# Patient Record
Sex: Male | Born: 1985
Health system: Southern US, Community
[De-identification: ages and names within clinical notes are randomized; demographics above are authoritative.]

## PROBLEM LIST (undated history)

## (undated) DIAGNOSIS — K219 Gastro-esophageal reflux disease without esophagitis: Secondary | ICD-10-CM

## (undated) DIAGNOSIS — F419 Anxiety disorder, unspecified: Secondary | ICD-10-CM

## (undated) DIAGNOSIS — G4730 Sleep apnea, unspecified: Secondary | ICD-10-CM

## (undated) DIAGNOSIS — F32A Depression, unspecified: Secondary | ICD-10-CM

## (undated) DIAGNOSIS — C801 Malignant (primary) neoplasm, unspecified: Secondary | ICD-10-CM

## (undated) HISTORY — DX: Anxiety disorder, unspecified: F41.9

## (undated) HISTORY — PX: OTHER SURGICAL HISTORY: SHX169

## (undated) HISTORY — DX: Depression, unspecified: F32.A

---

## 2014-07-14 ENCOUNTER — Other Ambulatory Visit: Payer: Self-pay | Admitting: Sports Medicine

## 2014-07-14 DIAGNOSIS — M79604 Pain in right leg: Secondary | ICD-10-CM

## 2014-07-14 DIAGNOSIS — M545 Low back pain: Secondary | ICD-10-CM

## 2014-07-14 DIAGNOSIS — M5126 Other intervertebral disc displacement, lumbar region: Secondary | ICD-10-CM

## 2014-07-19 ENCOUNTER — Ambulatory Visit
Admission: RE | Admit: 2014-07-19 | Discharge: 2014-07-19 | Disposition: A | Discharge status: Home / Self Care | Payer: 59 | Source: Ambulatory Visit | Attending: Sports Medicine | Admitting: Sports Medicine

## 2014-07-19 VITALS — BP 153/91 | HR 82 | Ht 71.0 in | Wt 280.0 lb

## 2014-07-19 DIAGNOSIS — M545 Low back pain, unspecified: Secondary | ICD-10-CM

## 2014-07-19 DIAGNOSIS — M5126 Other intervertebral disc displacement, lumbar region: Secondary | ICD-10-CM

## 2014-07-19 MED ORDER — METHYLPREDNISOLONE ACETATE 40 MG/ML INJ SUSP (RADIOLOG
120.0000 mg | Freq: Once | INTRAMUSCULAR | Status: AC
  Administered 2014-07-19: 120 mg via EPIDURAL

## 2014-07-19 MED ORDER — IOHEXOL 180 MG/ML  SOLN
1.0000 mL | Freq: Once | INTRAMUSCULAR | Status: AC | PRN
  Administered 2014-07-19: 1 mL via EPIDURAL

## 2014-07-19 NOTE — Discharge Instructions (Signed)

## 2014-07-25 ENCOUNTER — Other Ambulatory Visit: Payer: Self-pay | Admitting: Sports Medicine

## 2014-07-25 DIAGNOSIS — M5126 Other intervertebral disc displacement, lumbar region: Secondary | ICD-10-CM

## 2014-07-25 DIAGNOSIS — M545 Low back pain: Secondary | ICD-10-CM

## 2014-08-03 ENCOUNTER — Ambulatory Visit
Admission: RE | Admit: 2014-08-03 | Discharge: 2014-08-03 | Disposition: A | Discharge status: Home / Self Care | Payer: 59 | Source: Ambulatory Visit | Attending: Sports Medicine | Admitting: Sports Medicine

## 2014-08-03 DIAGNOSIS — M545 Low back pain: Secondary | ICD-10-CM

## 2014-08-03 DIAGNOSIS — M5126 Other intervertebral disc displacement, lumbar region: Secondary | ICD-10-CM

## 2014-08-03 MED ORDER — IOHEXOL 180 MG/ML  SOLN: 1.0000 mL | Freq: Once | INTRAMUSCULAR | Status: AC | PRN

## 2014-08-03 MED ORDER — METHYLPREDNISOLONE ACETATE 40 MG/ML INJ SUSP (RADIOLOG: 120.0000 mg | Freq: Once | INTRAMUSCULAR | Status: DC

## 2015-06-12 ENCOUNTER — Ambulatory Visit: Payer: Self-pay

## 2015-06-12 ENCOUNTER — Other Ambulatory Visit: Payer: Self-pay | Admitting: Occupational Medicine

## 2015-06-12 DIAGNOSIS — Z0000 Encounter for general adult medical examination without abnormal findings: Secondary | ICD-10-CM

## 2017-01-27 IMAGING — CR DG CHEST 1V
1 series · 1 of 1 positions shown · non-contrast
Comparison: None.

CLINICAL DATA: Annual physical exam

EXAM:
CHEST 1 VIEW

[view not recorded]
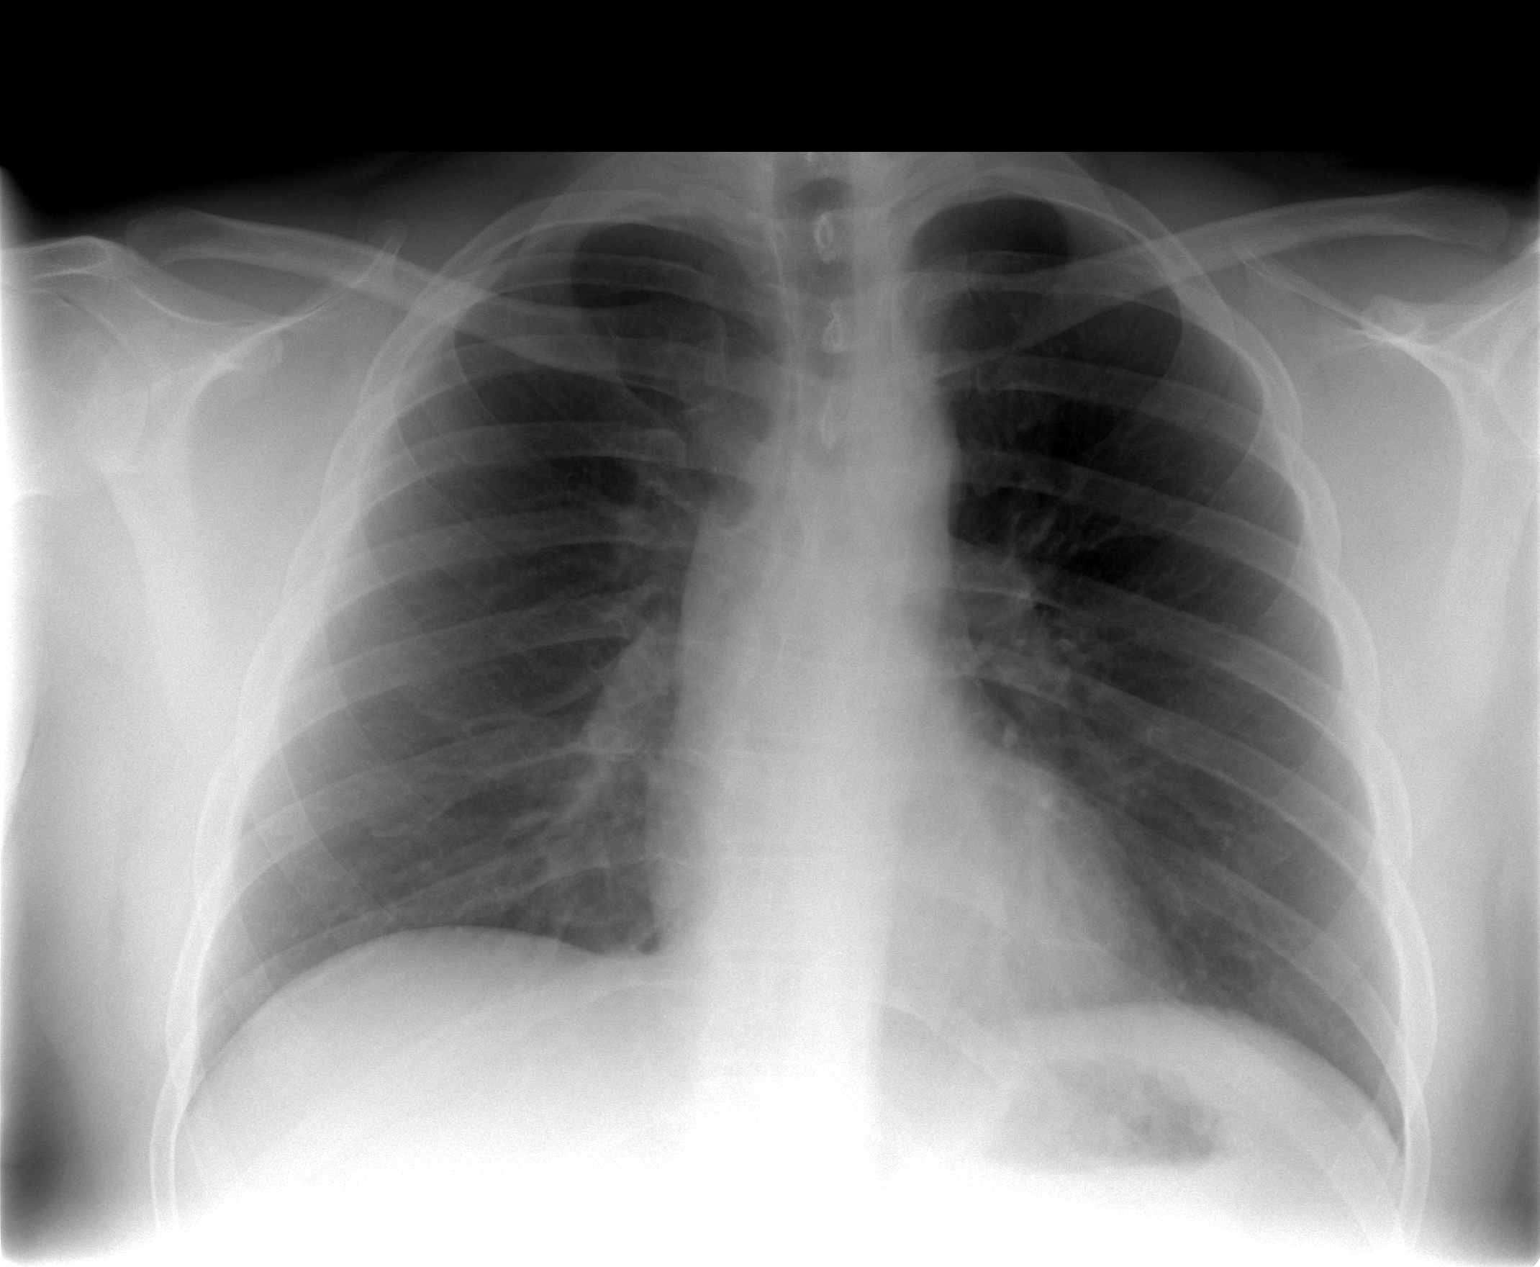

[1 of 1 positions shown; findings below may reference images not displayed]

FINDINGS: No active infiltrate or effusion is seen. Mediastinal and hilar
contours are unremarkable. The heart is within normal limits in
size. No bony abnormality is seen.
IMPRESSION: No active disease.

## 2019-09-19 ENCOUNTER — Other Ambulatory Visit (HOSPITAL_BASED_OUTPATIENT_CLINIC_OR_DEPARTMENT_OTHER): Payer: Self-pay

## 2019-09-19 DIAGNOSIS — G4730 Sleep apnea, unspecified: Secondary | ICD-10-CM

## 2019-12-08 ENCOUNTER — Ambulatory Visit (HOSPITAL_BASED_OUTPATIENT_CLINIC_OR_DEPARTMENT_OTHER): Payer: 59 | Attending: Psychiatry | Admitting: Internal Medicine

## 2019-12-08 ENCOUNTER — Other Ambulatory Visit: Payer: Self-pay

## 2019-12-08 DIAGNOSIS — G4733 Obstructive sleep apnea (adult) (pediatric): Secondary | ICD-10-CM | POA: Diagnosis not present

## 2019-12-08 DIAGNOSIS — G4730 Sleep apnea, unspecified: Secondary | ICD-10-CM

## 2019-12-10 DIAGNOSIS — G4730 Sleep apnea, unspecified: Secondary | ICD-10-CM

## 2019-12-10 NOTE — Procedures (Signed)
    Patient Name: Terry Anderson, Terry Anderson Date: 12/08/2019 Gender: Male D.O.B: Apr 07, 1986 Age (years): 33 Referring Provider: Angus Palms Height (inches): 71 Interpreting Physician: Jetty Duhamel MD, ABSM Weight (lbs): 270 RPSGT: Celene Kras BMI: 38 MRN: [id number] Neck Size: 17.00  CLINICAL INFORMATION Sleep Study Type: HST Indication for sleep study: OSA Epworth Sleepiness Score: 1  SLEEP STUDY TECHNIQUE A multi-channel overnight portable sleep study was performed. The channels recorded were: nasal airflow, thoracic respiratory movement, and oxygen saturation with a pulse oximetry. Snoring was also monitored.  MEDICATIONS Patient self administered medications include: none reported.  SLEEP ARCHITECTURE Patient was studied for 372.5 minutes. The sleep efficiency was 99.5 % and the patient was supine for 60.4%. The arousal index was 0.0 per hour.  RESPIRATORY PARAMETERS The overall AHI was 18.8 per hour, with a central apnea index of 0.0 per hour. The oxygen nadir was 79% during sleep.  CARDIAC DATA Mean heart rate during sleep was 68.0 bpm.  IMPRESSIONS - Moderate obstructive sleep apnea occurred during this study (AHI = 18.8/h). - No significant central sleep apnea occurred during this study (CAI = 0.0/h). - Oxygen desaturation was noted during this study (Min O2 = 79%). Mean sat 94%. - Patient snored.  DIAGNOSIS - Obstructive Sleep Apnea (G47.33)  RECOMMENDATIONS - Suggest CPAP titration sleep study or autopap. Other options would be based on clinical judgment. - Be careful with alcohol, sedatives and other CNS depressants that may worsen sleep apnea and disrupt normal sleep architecture. - Sleep hygiene should be reviewed to assess factors that may improve sleep quality. - Weight management and regular exercise should be initiated or continued.  [Electronically signed] 12/10/2019 12:14 PM  Jetty Duhamel MD, ABSM Diplomate, American Board of Sleep  Medicine   NPI: [id number]                         Jetty Duhamel Diplomate, American Board of Sleep Medicine  ELECTRONICALLY SIGNED ON:  12/10/2019, 12:12 PM [facility] PH: [phone]   FX: [phone] ACCREDITED BY THE AMERICAN ACADEMY OF SLEEP MEDICINE

## 2020-08-25 ENCOUNTER — Encounter (HOSPITAL_BASED_OUTPATIENT_CLINIC_OR_DEPARTMENT_OTHER): Payer: Self-pay | Admitting: *Deleted

## 2020-08-25 ENCOUNTER — Inpatient Hospital Stay (HOSPITAL_BASED_OUTPATIENT_CLINIC_OR_DEPARTMENT_OTHER)
Admission: EM | Admit: 2020-08-25 | Discharge: 2020-08-28 | DRG: 390 | Disposition: A | Discharge status: Home / Self Care | Payer: 59 | Attending: Internal Medicine | Admitting: Internal Medicine

## 2020-08-25 ENCOUNTER — Other Ambulatory Visit: Payer: Self-pay

## 2020-08-25 ENCOUNTER — Emergency Department (HOSPITAL_BASED_OUTPATIENT_CLINIC_OR_DEPARTMENT_OTHER): Payer: 59

## 2020-08-25 DIAGNOSIS — K219 Gastro-esophageal reflux disease without esophagitis: Secondary | ICD-10-CM | POA: Diagnosis present

## 2020-08-25 DIAGNOSIS — Z8616 Personal history of COVID-19: Secondary | ICD-10-CM

## 2020-08-25 DIAGNOSIS — K56609 Unspecified intestinal obstruction, unspecified as to partial versus complete obstruction: Secondary | ICD-10-CM | POA: Diagnosis not present

## 2020-08-25 DIAGNOSIS — R1010 Upper abdominal pain, unspecified: Secondary | ICD-10-CM | POA: Diagnosis not present

## 2020-08-25 DIAGNOSIS — R112 Nausea with vomiting, unspecified: Secondary | ICD-10-CM

## 2020-08-25 DIAGNOSIS — Z79899 Other long term (current) drug therapy: Secondary | ICD-10-CM

## 2020-08-25 DIAGNOSIS — F419 Anxiety disorder, unspecified: Secondary | ICD-10-CM | POA: Diagnosis present

## 2020-08-25 DIAGNOSIS — R739 Hyperglycemia, unspecified: Secondary | ICD-10-CM | POA: Diagnosis present

## 2020-08-25 DIAGNOSIS — R1084 Generalized abdominal pain: Secondary | ICD-10-CM

## 2020-08-25 DIAGNOSIS — F32A Depression, unspecified: Secondary | ICD-10-CM | POA: Diagnosis present

## 2020-08-25 LAB — COMPREHENSIVE METABOLIC PANEL
ALT: 33 U/L (ref 0–44)
AST: 18 U/L (ref 15–41)
Albumin: 5 g/dL (ref 3.5–5.0)
Alkaline Phosphatase: 70 U/L (ref 38–126)
Anion gap: 12 (ref 5–15)
BUN: 18 mg/dL (ref 6–20)
CO2: 25 mmol/L (ref 22–32)
Calcium: 10.1 mg/dL (ref 8.9–10.3)
Chloride: 99 mmol/L (ref 98–111)
Creatinine, Ser: 1.04 mg/dL (ref 0.61–1.24)
GFR, Estimated: >60 mL/min (ref >60)
Glucose, Bld: 151 mg/dL — ABNORMAL HIGH (ref 70–99)
Potassium: 4 mmol/L (ref 3.5–5.1)
Sodium: 136 mmol/L (ref 135–145)
Total Bilirubin: 0.6 mg/dL (ref 0.3–1.2)
Total Protein: 7.9 g/dL (ref 6.5–8.1)

## 2020-08-25 LAB — CBC
HCT: 49.7 % (ref 39.0–52.0)
Hemoglobin: 17.1 g/dL — ABNORMAL HIGH (ref 13.0–17.0)
MCH: 28.3 pg (ref 26.0–34.0)
MCHC: 34.4 g/dL (ref 30.0–36.0)
MCV: 82.3 fL (ref 80.0–100.0)
Platelets: 320 10*3/uL (ref 150–400)
RBC: 6.04 MIL/uL — ABNORMAL HIGH (ref 4.22–5.81)
RDW: 12.1 % (ref 11.5–15.5)
WBC: 10 10*3/uL (ref 4.0–10.5)
nRBC: 0 % (ref 0.0–0.2)

## 2020-08-25 LAB — URINALYSIS, ROUTINE W REFLEX MICROSCOPIC
Bilirubin Urine: NEGATIVE
Glucose, UA: NEGATIVE mg/dL
Hgb urine dipstick: NEGATIVE
Ketones, ur: NEGATIVE mg/dL
Leukocytes,Ua: NEGATIVE
Nitrite: NEGATIVE
Protein, ur: NEGATIVE mg/dL
Specific Gravity, Urine: 1.009 (ref 1.005–1.030)
pH: 6 (ref 5.0–8.0)

## 2020-08-25 LAB — LIPASE, BLOOD: Lipase: 14 U/L (ref 11–51)

## 2020-08-25 MED ORDER — FAMOTIDINE 20 MG PO TABS
20.0000 mg | ORAL_TABLET | Freq: Once | ORAL | Status: AC
  Administered 2020-08-25: 20 mg via ORAL
  Filled 2020-08-25: qty 1

## 2020-08-25 MED ORDER — HYDROMORPHONE HCL 1 MG/ML IJ SOLN
0.5000 mg | Freq: Once | INTRAMUSCULAR | Status: AC
  Administered 2020-08-25: 0.5 mg via INTRAVENOUS
  Filled 2020-08-25: qty 1

## 2020-08-25 MED ORDER — FAMOTIDINE IN NACL 20-0.9 MG/50ML-% IV SOLN
20.0000 mg | Freq: Once | INTRAVENOUS | Status: AC
  Administered 2020-08-25: 20 mg via INTRAVENOUS
  Filled 2020-08-25: qty 50

## 2020-08-25 MED ORDER — SODIUM CHLORIDE 0.9 % IV BOLUS
1000.0000 mL | Freq: Once | INTRAVENOUS | Status: AC
  Administered 2020-08-25: 1000 mL via INTRAVENOUS

## 2020-08-25 MED ORDER — ONDANSETRON 4 MG PO TBDP
4.0000 mg | ORAL_TABLET | Freq: Once | ORAL | Status: AC | PRN
  Administered 2020-08-25: 4 mg via ORAL
  Filled 2020-08-25: qty 1

## 2020-08-25 MED ORDER — ALUM & MAG HYDROXIDE-SIMETH 200-200-20 MG/5ML PO SUSP
30.0000 mL | Freq: Once | ORAL | Status: AC
  Administered 2020-08-25: 30 mL via ORAL
  Filled 2020-08-25: qty 30

## 2020-08-25 MED ORDER — METOCLOPRAMIDE HCL 5 MG/ML IJ SOLN
10.0000 mg | Freq: Once | INTRAMUSCULAR | Status: AC
  Administered 2020-08-25: 10 mg via INTRAVENOUS
  Filled 2020-08-25: qty 2

## 2020-08-25 MED ORDER — ONDANSETRON HCL 4 MG/2ML IJ SOLN
4.0000 mg | Freq: Once | INTRAMUSCULAR | Status: AC
  Administered 2020-08-25: 4 mg via INTRAVENOUS
  Filled 2020-08-25: qty 2

## 2020-08-25 MED ORDER — HYDROMORPHONE HCL 1 MG/ML IJ SOLN
1.0000 mg | Freq: Once | INTRAMUSCULAR | Status: AC
  Administered 2020-08-25: 1 mg via INTRAVENOUS
  Filled 2020-08-25: qty 1

## 2020-08-25 MED ORDER — LACTATED RINGERS IV BOLUS
1000.0000 mL | Freq: Once | INTRAVENOUS | Status: AC
  Administered 2020-08-25: 1000 mL via INTRAVENOUS

## 2020-08-25 NOTE — ED Notes (Signed)
Patient transported to CT 

## 2020-08-25 NOTE — ED Triage Notes (Signed)
Patient reports nausea vomiting (x1 episode) and abdominal pain since this am. Last BM last night.

## 2020-08-25 NOTE — ED Provider Notes (Addendum)
[facility] EMERGENCY DEPT Provider Note   CSN: [id number] Arrival date & time: 08/25/20  1732     History Chief Complaint  Patient presents with  . Abdominal Pain  . Nausea  . Emesis    Xayne Brumbaugh is a 35 y.o. male.  Patient c/o upper abd pain and vomiting earlier today. States acute onset symptoms around 10 am today, moderate, dull, non radiating, but seemed to move to left abd and then down - currently reports pain resolved. +nausea/vomiting. No bloody or bilious emesis. No abd distension. No diarrhea or constipation, had normal bm today. No known bad food ingestion or ill contacts. Takes antacid. Denies hx pud, pancreatitis or gallstones. No back/flank pain. No hx kidney stones. No hematuria.   The history is provided by the patient.  Abdominal Pain Associated symptoms: nausea and vomiting   Associated symptoms: no chest pain, no chills, no constipation, no cough, no diarrhea, no dysuria, no fever, no shortness of breath and no sore throat   Emesis Associated symptoms: abdominal pain   Associated symptoms: no chills, no cough, no diarrhea, no fever, no headaches and no sore throat        History reviewed. No pertinent past medical history.  There are no problems to display for this patient.   History reviewed. No pertinent surgical history.     History reviewed. No pertinent family history.  Social History   Tobacco Use  . Smoking status: Never Smoker  . Smokeless tobacco: Never Used    Home Medications Prior to Admission medications   Not on File    Allergies    Patient has no known allergies.  Review of Systems   Review of Systems  Constitutional: Negative for chills and fever.  HENT: Negative for sore throat.   Eyes: Negative for redness.  Respiratory: Negative for cough and shortness of breath.   Cardiovascular: Negative for chest pain and leg swelling.  Gastrointestinal: Positive for abdominal pain, nausea and vomiting. Negative  for blood in stool, constipation and diarrhea.  Genitourinary: Negative for dysuria and flank pain.  Musculoskeletal: Negative for back pain and neck pain.  Skin: Negative for rash.  Neurological: Negative for headaches.  Hematological: Does not bruise/bleed easily.  Psychiatric/Behavioral: Negative for confusion.    Physical Exam Updated Vital Signs BP (!) 146/100 (BP Location: Left Arm)   Pulse (!) 135   Temp 98.8 F (37.1 C) (Oral)   Resp 17   SpO2 97%   Physical Exam Vitals and nursing note reviewed.  Constitutional:      Appearance: Normal appearance. He is well-developed.  HENT:     Head: Atraumatic.     Nose: Nose normal.     Mouth/Throat:     Mouth: Mucous membranes are moist.     Pharynx: Oropharynx is clear.  Eyes:     General: No scleral icterus.    Conjunctiva/sclera: Conjunctivae normal.  Neck:     Trachea: No tracheal deviation.  Cardiovascular:     Rate and Rhythm: Regular rhythm. Tachycardia present.     Pulses: Normal pulses.     Heart sounds: Normal heart sounds. No murmur heard. No friction rub. No gallop.   Pulmonary:     Effort: Pulmonary effort is normal. No accessory muscle usage or respiratory distress.     Breath sounds: Normal breath sounds.  Abdominal:     General: Bowel sounds are normal. There is no distension.     Palpations: Abdomen is soft. There is no mass.  Tenderness: There is no abdominal tenderness. There is no guarding or rebound.     Hernia: No hernia is present.  Genitourinary:    Comments: No cva tenderness. Musculoskeletal:        General: No swelling or tenderness.     Cervical back: Normal range of motion and neck supple. No rigidity.     Right lower leg: No edema.     Left lower leg: No edema.  Skin:    General: Skin is warm and dry.     Findings: No rash.  Neurological:     Mental Status: He is alert.     Comments: Alert, speech clear.   Psychiatric:        Mood and Affect: Mood normal.     ED Results /  Procedures / Treatments   Labs (all labs ordered are listed, but only abnormal results are displayed) Results for orders placed or performed during the hospital encounter of 08/25/20  Comprehensive metabolic panel  Result Value Ref Range   Sodium 136 135 - 145 mmol/L   Potassium 4.0 3.5 - 5.1 mmol/L   Chloride 99 98 - 111 mmol/L   CO2 25 22 - 32 mmol/L   Glucose, Bld 151 (H) 70 - 99 mg/dL   BUN 18 6 - 20 mg/dL   Creatinine, Ser 2.24 0.61 - 1.24 mg/dL   Calcium 82.5 8.9 - 00.3 mg/dL   Total Protein 7.9 6.5 - 8.1 g/dL   Albumin 5.0 3.5 - 5.0 g/dL   AST 18 15 - 41 U/L   ALT 33 0 - 44 U/L   Alkaline Phosphatase 70 38 - 126 U/L   Total Bilirubin 0.6 0.3 - 1.2 mg/dL   GFR, Estimated >70 >48 mL/min   Anion gap 12 5 - 15  CBC  Result Value Ref Range   WBC 10.0 4.0 - 10.5 K/uL   RBC 6.04 (H) 4.22 - 5.81 MIL/uL   Hemoglobin 17.1 (H) 13.0 - 17.0 g/dL   HCT 88.9 16.9 - 45.0 %   MCV 82.3 80.0 - 100.0 fL   MCH 28.3 26.0 - 34.0 pg   MCHC 34.4 30.0 - 36.0 g/dL   RDW 38.8 82.8 - 00.3 %   Platelets 320 150 - 400 K/uL   nRBC 0.0 0.0 - 0.2 %  Lipase, blood  Result Value Ref Range   Lipase 14 11 - 51 U/L  Urinalysis, Routine w reflex microscopic Urine, Clean Catch  Result Value Ref Range   Color, Urine YELLOW YELLOW   APPearance CLEAR CLEAR   Specific Gravity, Urine 1.009 1.005 - 1.030   pH 6.0 5.0 - 8.0   Glucose, UA NEGATIVE NEGATIVE mg/dL   Hgb urine dipstick NEGATIVE NEGATIVE   Bilirubin Urine NEGATIVE NEGATIVE   Ketones, ur NEGATIVE NEGATIVE mg/dL   Protein, ur NEGATIVE NEGATIVE mg/dL   Nitrite NEGATIVE NEGATIVE   Leukocytes,Ua NEGATIVE NEGATIVE    EKG None  Radiology CT Abdomen Pelvis Wo Contrast  Result Date: 08/25/2020 CLINICAL DATA:  Acute abdominal pain. Epigastric and left-sided abdominal pain. Nausea and vomiting. EXAM: CT ABDOMEN AND PELVIS WITHOUT CONTRAST TECHNIQUE: Multidetector CT imaging of the abdomen and pelvis was performed following the standard protocol  without IV contrast. COMPARISON:  None. FINDINGS: Lower chest: Lung bases are clear. Hepatobiliary: Diffusely decreased hepatic density consistent with steatosis. No evidence of focal hepatic abnormality on noncontrast exam. Decompressed gallbladder. No calcified gallstone or pericholecystic inflammation. Pancreas: No ductal dilatation or inflammation. Spleen: Normal in size without focal  abnormality. Splenule at the hilum. Adrenals/Urinary Tract: Normal adrenal glands. No hydronephrosis or renal calculi. No perinephric edema. No evidence of focal renal abnormality on noncontrast exam. Unremarkable urinary bladder. Stomach/Bowel: Marked gastric distention with intraluminal fluid. Small bowel diffusely dilated and fluid-filled. There is gradual transition from dilated to nondilated bowel loops in the right pelvis, though no discrete transition point. Distal ileal bowel loops are decompressed. There is mild mesenteric edema. No mesenteric swirling to suggest internal hernia. No bowel pneumatosis. Normal appendix. Small volume of colonic stool. Vascular/Lymphatic: Unremarkable noncontrast vascular structures. No abdominopelvic adenopathy. Reproductive: Prostate is unremarkable. Other: Minimal mesenteric edema. No ascites or free air. Tiny fat containing umbilical hernia. Minimal fat in the inguinal canals, right greater than left. Musculoskeletal: Mild L5-S1 degenerative disc disease, no pars defects. There are no acute or suspicious osseous abnormalities. IMPRESSION: 1. Diffusely dilated and fluid-filled small bowel with gradual transition from dilated to nondilated bowel loops in the right pelvis, though no discrete transition point. There is mild mesenteric edema. Findings are suspicious for early small bowel obstruction, underlying cause is not delineated on this exam. 2. Hepatic steatosis. 3. Tiny fat containing umbilical hernia. Minimal fat in the inguinal canals, right greater than left. Electronically Signed    By: Narda Rutherford M.D.   On: 08/25/2020 22:08    Procedures Procedures   Medications Ordered in ED Medications  HYDROmorphone (DILAUDID) injection 0.5 mg (has no administration in time range)  ondansetron (ZOFRAN-ODT) disintegrating tablet 4 mg (4 mg Oral Given 08/25/20 1754)  sodium chloride 0.9 % bolus 1,000 mL (1,000 mLs Intravenous New Bag/Given 08/25/20 1835)  ondansetron (ZOFRAN) injection 4 mg (4 mg Intravenous Given 08/25/20 1835)    ED Course  I have reviewed the triage vital signs and the nursing notes.  Pertinent labs & imaging results that were available during my care of the patient were reviewed by me and considered in my medical decision making (see chart for details).    MDM Rules/Calculators/A&P                         Iv ns bolus. Labs sent. zofran iv.   Reviewed nursing notes and prior charts for additional history.   Labs reviewed/interpreted by me - wbc normal, hct normal.   Pain recurs, left flank. Moderate/persistent. Will get ct.   CT reviewed/interpreted by me - ?sbo.   No incarcerated hernia on exam, no hx abd surgery - cause of suspected sbo not clear.   Pt continues to have episodic nv and pain. Remains tachy. Additional LR bolus. Dilaudid 1 mg iv. zofran iv.   Medicine consulted for admission. Discussed w Dr Delma Post who accepts in transfer/admit.   NG tube to liws.     Final Clinical Impression(s) / ED Diagnoses Final diagnoses:  None    Rx / DC Orders ED Discharge Orders    None          Cathren Laine, MD 08/25/20 2329

## 2020-08-25 NOTE — ED Notes (Signed)
Patient with large episode of vomiting during triage. IV and labs drawn in triage, patient moved to room.

## 2020-08-26 DIAGNOSIS — R739 Hyperglycemia, unspecified: Secondary | ICD-10-CM | POA: Diagnosis present

## 2020-08-26 DIAGNOSIS — Z8616 Personal history of COVID-19: Secondary | ICD-10-CM | POA: Diagnosis not present

## 2020-08-26 DIAGNOSIS — R112 Nausea with vomiting, unspecified: Secondary | ICD-10-CM | POA: Diagnosis not present

## 2020-08-26 DIAGNOSIS — R1010 Upper abdominal pain, unspecified: Secondary | ICD-10-CM | POA: Diagnosis present

## 2020-08-26 DIAGNOSIS — Z79899 Other long term (current) drug therapy: Secondary | ICD-10-CM | POA: Diagnosis not present

## 2020-08-26 DIAGNOSIS — F419 Anxiety disorder, unspecified: Secondary | ICD-10-CM | POA: Diagnosis present

## 2020-08-26 DIAGNOSIS — K56609 Unspecified intestinal obstruction, unspecified as to partial versus complete obstruction: Secondary | ICD-10-CM | POA: Diagnosis present

## 2020-08-26 DIAGNOSIS — F32A Depression, unspecified: Secondary | ICD-10-CM | POA: Diagnosis present

## 2020-08-26 DIAGNOSIS — K219 Gastro-esophageal reflux disease without esophagitis: Secondary | ICD-10-CM | POA: Diagnosis present

## 2020-08-26 LAB — RAPID URINE DRUG SCREEN, HOSP PERFORMED
Amphetamines: NOT DETECTED
Barbiturates: NOT DETECTED
Benzodiazepines: NOT DETECTED
Cocaine: NOT DETECTED
Opiates: NOT DETECTED
Tetrahydrocannabinol: NOT DETECTED

## 2020-08-26 LAB — HEMOGLOBIN A1C
Hgb A1c MFr Bld: 5.5 % (ref 4.8–5.6)
Mean Plasma Glucose: 111.15 mg/dL

## 2020-08-26 LAB — RESP PANEL BY RT-PCR (FLU A&B, COVID) ARPGX2
Influenza A by PCR: NEGATIVE
Influenza B by PCR: NEGATIVE
SARS Coronavirus 2 by RT PCR: POSITIVE — AB

## 2020-08-26 LAB — HIV ANTIBODY (ROUTINE TESTING W REFLEX): HIV Screen 4th Generation wRfx: NONREACTIVE

## 2020-08-26 MED ORDER — LACTATED RINGERS IV SOLN: INTRAVENOUS | Status: DC

## 2020-08-26 MED ORDER — PROCHLORPERAZINE EDISYLATE 10 MG/2ML IJ SOLN: 10.0000 mg | Freq: Four times a day (QID) | INTRAMUSCULAR | Status: DC | PRN

## 2020-08-26 MED ORDER — PANTOPRAZOLE SODIUM 40 MG PO TBEC
40.0000 mg | DELAYED_RELEASE_TABLET | Freq: Every day | ORAL | Status: DC
  Administered 2020-08-26 – 2020-08-28 (×3): 40 mg via ORAL
  Filled 2020-08-26 (×3): qty 1

## 2020-08-26 MED ORDER — TRAMADOL HCL 50 MG PO TABS
50.0000 mg | ORAL_TABLET | Freq: Three times a day (TID) | ORAL | Status: DC | PRN
  Administered 2020-08-28: 50 mg via ORAL
  Filled 2020-08-26: qty 1

## 2020-08-26 MED ORDER — BUPROPION HCL ER (SR) 100 MG PO TB12
200.0000 mg | ORAL_TABLET | Freq: Two times a day (BID) | ORAL | Status: DC
  Administered 2020-08-26 – 2020-08-28 (×5): 200 mg via ORAL
  Filled 2020-08-26 (×5): qty 2

## 2020-08-26 MED ORDER — LORAZEPAM 2 MG/ML IJ SOLN
0.5000 mg | Freq: Once | INTRAMUSCULAR | Status: AC
  Administered 2020-08-26: 0.5 mg via INTRAVENOUS
  Filled 2020-08-26: qty 1

## 2020-08-26 NOTE — Plan of Care (Signed)

## 2020-08-26 NOTE — ED Notes (Signed)
Pt ambulated to the bathroom steady gait

## 2020-08-26 NOTE — ED Notes (Signed)
Report given to Tiffany @ [facility]

## 2020-08-26 NOTE — ED Notes (Signed)
Introduced myself to pt - pt requesting his Wellbutrin - having anxiety and has not taken his meds since yesterday

## 2020-08-26 NOTE — H&P (Signed)
History and Physical    Terry Anderson IPJ:[id number] DOB: 08-Sep-1985 DOA: 08/25/2020  PCP: Pcp, No  Patient coming from: Home  Chief Complaint: ab pain  HPI: Terry Anderson is a 35 y.o. male with medical history significant of anxiety/depression. Presenting with abdominal pain, N/V. He was in his normal state of health until yesterday morning. He reports that he woke up yesterday morning with sharp abdominal pain across the upper quadrants. It was constant, but worse with movement. He had nausea and vomiting at the same time. He thought it was constipated, so he took some laxatives. He had a small BM, but it didn't help the pain. He tried a hot shower. It helped the pain and nausea a little, but didn't completely relieve the symptoms. Later in the evening, he noticed that he was having diarrhea and that continued all night. He decided to come to the ED for help. He denies any other aggravating or alleviating factors.    ED Course: His labs were remarkable only for hyperglycemia. His imaging showed possible early SBO. He was tachycardic. He was going to have an NGT place, but then he had BMs and his N/V improved. [facility] was called for admission.   Review of Systems:  Denies CP, palpitations, dyspnea, lightheadedness, weakness, sick contacts, hematochezia, hematemesis. Review of systems is otherwise negative for all not mentioned in HPI.   PMHx GERD Depression/Anxiety  PSHx None  SocHx  reports that he has never smoked. He has never used smokeless tobacco. No history on file for alcohol use and drug use.  No Known Allergies  FamHx History reviewed. No pertinent family history.  Prior to Admission medications   Medication Sig Start Date End Date Taking? Authorizing Provider  buPROPion (WELLBUTRIN SR) 200 MG 12 hr tablet Take 200 mg by mouth 2 (two) times daily. 06/04/20   [provider]    Physical Exam: Vitals:   08/26/20 0711 08/26/20 0713 08/26/20 0806 08/26/20 0849  BP: (!)  146/101 (!) 146/101 (!) 141/99 (!) 156/104  Pulse:  (!) 101 (!) 104 99  Resp: (!) 22 (!) 22 (!) 21 20  Temp:  98.8 F (37.1 C)  98.2 F (36.8 C)  TempSrc:  Oral  Oral  SpO2:  99% 94% 99%  Weight:  122 kg    Height:  5\' 11"  (1.803 m)      General: 35 y.o. male resting in bed in NAD Eyes: PERRL, normal sclera ENMT: Nares patent w/o discharge, orophaynx clear, dentition normal, ears w/o discharge/lesions/ulcers Neck: Supple, trachea midline Cardiovascular: RRR, +S1, S2, no m/g/r, equal pulses throughout Respiratory: CTABL, no w/r/r, normal WOB GI: BS+, NDNT, no masses noted, no organomegaly noted MSK: No e/c/c Skin: No rashes, bruises, ulcerations noted Neuro: A&O x 3, no focal deficits Psyc: Appropriate interaction and affect, calm/cooperative  Labs on Admission: I have personally reviewed following labs and imaging studies  CBC: Recent Labs  Lab 08/25/20 1813  WBC 10.0  HGB 17.1*  HCT 49.7  MCV 82.3  PLT 320   Basic Metabolic Panel: Recent Labs  Lab 08/25/20 1813  NA 136  K 4.0  CL 99  CO2 25  GLUCOSE 151*  BUN 18  CREATININE 1.04  CALCIUM 10.1   GFR: Estimated Creatinine Clearance: 131.8 mL/min (by C-G formula based on SCr of 1.04 mg/dL). Liver Function Tests: Recent Labs  Lab 08/25/20 1813  AST 18  ALT 33  ALKPHOS 70  BILITOT 0.6  PROT 7.9  ALBUMIN 5.0   Recent Labs  Lab 08/25/20 1813  LIPASE 14   No results for input(s): AMMONIA in the last 168 hours. Coagulation Profile: No results for input(s): INR, PROTIME in the last 168 hours. Cardiac Enzymes: No results for input(s): CKTOTAL, CKMB, CKMBINDEX, TROPONINI in the last 168 hours. BNP (last 3 results) No results for input(s): PROBNP in the last 8760 hours. HbA1C: No results for input(s): HGBA1C in the last 72 hours. CBG: No results for input(s): GLUCAP in the last 168 hours. Lipid Profile: No results for input(s): CHOL, HDL, LDLCALC, TRIG, CHOLHDL, LDLDIRECT in the last 72  hours. Thyroid Function Tests: No results for input(s): TSH, T4TOTAL, FREET4, T3FREE, THYROIDAB in the last 72 hours. Anemia Panel: No results for input(s): VITAMINB12, FOLATE, FERRITIN, TIBC, IRON, RETICCTPCT in the last 72 hours. Urine analysis:    Component Value Date/Time   COLORURINE YELLOW 08/25/2020 1802   APPEARANCEUR CLEAR 08/25/2020 1802   LABSPEC 1.009 08/25/2020 1802   PHURINE 6.0 08/25/2020 1802   GLUCOSEU NEGATIVE 08/25/2020 1802   HGBUR NEGATIVE 08/25/2020 1802   BILIRUBINUR NEGATIVE 08/25/2020 1802   KETONESUR NEGATIVE 08/25/2020 1802   PROTEINUR NEGATIVE 08/25/2020 1802   NITRITE NEGATIVE 08/25/2020 1802   LEUKOCYTESUR NEGATIVE 08/25/2020 1802    Radiological Exams on Admission: CT Abdomen Pelvis Wo Contrast  Result Date: 08/25/2020 CLINICAL DATA:  Acute abdominal pain. Epigastric and left-sided abdominal pain. Nausea and vomiting. EXAM: CT ABDOMEN AND PELVIS WITHOUT CONTRAST TECHNIQUE: Multidetector CT imaging of the abdomen and pelvis was performed following the standard protocol without IV contrast. COMPARISON:  None. FINDINGS: Lower chest: Lung bases are clear. Hepatobiliary: Diffusely decreased hepatic density consistent with steatosis. No evidence of focal hepatic abnormality on noncontrast exam. Decompressed gallbladder. No calcified gallstone or pericholecystic inflammation. Pancreas: No ductal dilatation or inflammation. Spleen: Normal in size without focal abnormality. Splenule at the hilum. Adrenals/Urinary Tract: Normal adrenal glands. No hydronephrosis or renal calculi. No perinephric edema. No evidence of focal renal abnormality on noncontrast exam. Unremarkable urinary bladder. Stomach/Bowel: Marked gastric distention with intraluminal fluid. Small bowel diffusely dilated and fluid-filled. There is gradual transition from dilated to nondilated bowel loops in the right pelvis, though no discrete transition point. Distal ileal bowel loops are decompressed. There  is mild mesenteric edema. No mesenteric swirling to suggest internal hernia. No bowel pneumatosis. Normal appendix. Small volume of colonic stool. Vascular/Lymphatic: Unremarkable noncontrast vascular structures. No abdominopelvic adenopathy. Reproductive: Prostate is unremarkable. Other: Minimal mesenteric edema. No ascites or free air. Tiny fat containing umbilical hernia. Minimal fat in the inguinal canals, right greater than left. Musculoskeletal: Mild L5-S1 degenerative disc disease, no pars defects. There are no acute or suspicious osseous abnormalities. IMPRESSION: 1. Diffusely dilated and fluid-filled small bowel with gradual transition from dilated to nondilated bowel loops in the right pelvis, though no discrete transition point. There is mild mesenteric edema. Findings are suspicious for early small bowel obstruction, underlying cause is not delineated on this exam. 2. Hepatic steatosis. 3. Tiny fat containing umbilical hernia. Minimal fat in the inguinal canals, right greater than left. Electronically Signed   By: Narda Rutherford M.D.   On: 08/25/2020 22:08    EKG: Independently reviewed. NSR, no st elevation  Assessment/Plan Early SBO N/V/abdominal pain     - CT shows: Diffusely dilated and fluid-filled small bowel with gradual transition from dilated to nondilated bowel loops in the right pelvis, though no discrete transition point. There is mild mesenteric edema. Findings are suspicious for early small bowel obstruction, underlying cause is not delineated on  this exam.     - his symptoms have largely resolved; he endorses BM since arriving at ED     - CLD, fluids, antiemetics, pain control PRN  Anxiety/Depression     - continue wellbutrin  Hyperglycemia     - no Hx of DM, check A1c  DVT prophylaxis: lovenox  Code Status: FULL  Family Communication: None at bedside  Consults called: None   Status is: Inpatient  Remains inpatient appropriate because:Hemodynamically unstable  and IV treatments appropriate due to intensity of illness or inability to take PO   Dispo: The patient is from: Home              Anticipated d/c is to: Home              Patient currently is not medically stable to d/c.   Difficult to place patient No  Time spent coordinating admission: 60 minutes  Reily Ilic A Koehn Salehi DO [facility]  If 7PM-7AM, please contact night-coverage [email or web address]  08/26/2020, 9:13 AM

## 2020-08-26 NOTE — ED Notes (Signed)
Pt requesting antianxiety meds - wellbutrin not avail at this location  Pt updated that [facility] is on the way

## 2020-08-26 NOTE — ED Notes (Signed)
Dr. Adela Lank aware of positive covid.

## 2020-08-27 ENCOUNTER — Inpatient Hospital Stay (HOSPITAL_COMMUNITY): Payer: 59

## 2020-08-27 DIAGNOSIS — K56609 Unspecified intestinal obstruction, unspecified as to partial versus complete obstruction: Principal | ICD-10-CM

## 2020-08-27 LAB — COMPREHENSIVE METABOLIC PANEL
ALT: 30 U/L (ref 0–44)
AST: 16 U/L (ref 15–41)
Albumin: 4.2 g/dL (ref 3.5–5.0)
Alkaline Phosphatase: 54 U/L (ref 38–126)
Anion gap: 9 (ref 5–15)
BUN: 10 mg/dL (ref 6–20)
CO2: 28 mmol/L (ref 22–32)
Calcium: 9.2 mg/dL (ref 8.9–10.3)
Chloride: 104 mmol/L (ref 98–111)
Creatinine, Ser: 1.03 mg/dL (ref 0.61–1.24)
GFR, Estimated: >60 mL/min (ref >60)
Glucose, Bld: 92 mg/dL (ref 70–99)
Potassium: 3.7 mmol/L (ref 3.5–5.1)
Sodium: 141 mmol/L (ref 135–145)
Total Bilirubin: 0.8 mg/dL (ref 0.3–1.2)
Total Protein: 7 g/dL (ref 6.5–8.1)

## 2020-08-27 LAB — CBC
HCT: 44.5 % (ref 39.0–52.0)
Hemoglobin: 14.7 g/dL (ref 13.0–17.0)
MCH: 28.4 pg (ref 26.0–34.0)
MCHC: 33 g/dL (ref 30.0–36.0)
MCV: 86.1 fL (ref 80.0–100.0)
Platelets: 238 10*3/uL (ref 150–400)
RBC: 5.17 MIL/uL (ref 4.22–5.81)
RDW: 12.1 % (ref 11.5–15.5)
WBC: 7.1 10*3/uL (ref 4.0–10.5)
nRBC: 0 % (ref 0.0–0.2)

## 2020-08-27 NOTE — TOC Progression Note (Signed)
Transition of Care [facility]) - Progression Note    Patient Details  Name: Silvia Markuson MRN: [id number] Date of Birth: 05-17-1985  Transition of Care [facility] CM/SW Contact  Geni Bers, RN Phone Number: 08/27/2020, 3:56 PM  Clinical Narrative:     Pt from home with spouse and plan to return home. TOC will continue to follow for any discharge home needs.   Expected Discharge Plan: Home/Self Care Barriers to Discharge: No Barriers Identified  Expected Discharge Plan and Services Expected Discharge Plan: Home/Self Care       Living arrangements for the past 2 months: Single Family Home                                       Social Determinants of Health (SDOH) Interventions    Readmission Risk Interventions No flowsheet data found.

## 2020-08-27 NOTE — Plan of Care (Signed)

## 2020-08-27 NOTE — Progress Notes (Signed)
PROGRESS NOTE    Terry Anderson  BZJ:[id number] DOB: 1985-12-03 DOA: 08/25/2020 PCP: Pcp, No   Chief Complain: Abdominal pain  Brief Narrative: Patient is a 35 year old male with history of anxiety/depression who presented with abdomen pain, nausea and vomiting.  He thought he was constipated and took some laxatives and had a small bowel movement but it did not help the pain.  He presented to the emergency department.  Lab work showed hyperglycemia.  CT abdomen/pelvis done in the emerge department showed possible early SBO, he was tachycardic on presentation.  After the CT scan he had bowel movements and his nausea and vomiting improved so NG tube not placed.  He continues to feel better today, but no bowel movement.  Plan to advance the diet and order a KUB for follow-up  Assessment & Plan:   Active Problems:   SBO (small bowel obstruction) (HCC)   Early SBO/nausea/vomiting/abdominal pain: CT abdomen/pelvis showed diffusely dilated, fluid-filled small bowel with gradual transition from dilated to nondilated bowel loops in the right pelvis.  No discrete transition point.  Findings with suspicion for early small bowel SBO After the CT scan, he started having bowel movements, so NG tube not placed.  His abdomen remains soft and nondistended today.  He denies any abdominal pain nausea or vomiting.  Plan is to advance her diet to full liquid for now and check abdominal x-ray.  If abdominal x-ray shows improvement, will advance the diet to soft and plan for discharge tomorrow to home.  COVID positivity: Initially found to be positive on 4/17.  Screening test was negative as per the rapid test was done at home few days ago.  He is going to work.  We will continue off isolation, Out of window for isolation,.  He does not have any symptoms of COVID.  No indication for treatment.   Anxiety/depression: On Wellbutrin  Hyperglycemia:Likely stress related. Hemoglobin A1c of 5.5.         DVT  prophylaxis:SCD Code Status: Full Family Communication: None at bed ide Status is: Inpatient  Remains inpatient appropriate because:Unsafe d/c plan   Dispo: The patient is from: Home              Anticipated d/c is to: Home              Patient currently is not medically stable to d/c.   Difficult to place patient No      Consultants: None Procedures:None  Antimicrobials:  Anti-infectives (From admission, onward)   None      Subjective:  Patient seen and examined the bedside this morning.  Hemodynamically stable.  Denies any abdomen pain no nausea or vomiting.  Interested to advance the diet   Objective: Vitals:   08/26/20 1653 08/26/20 2050 08/27/20 0050 08/27/20 0455  BP: 125/89 (!) 140/92 (!) 142/79 134/89  Pulse: (!) 102  94 80  Resp:  20 20 18   Temp: (!) 97.5 F (36.4 C)  98.3 F (36.8 C) 98.1 F (36.7 C)  TempSrc: Oral Oral Oral Oral  SpO2: 98% 99% 99% 99%  Weight:      Height:        Intake/Output Summary (Last 24 hours) at 08/27/2020 1012 Last data filed at 08/27/2020 0732 Gross per 24 hour  Intake 1432.27 ml  Output --  Net 1432.27 ml   Filed Weights   08/26/20 0713  Weight: 122 kg    Examination:  General exam: Appears calm and comfortable ,Not in distress HEENT:PERRL,Oral mucosa moist,  Ear/Nose normal on gross exam Respiratory system: Bilateral equal air entry, normal vesicular breath sounds, no wheezes or crackles  Cardiovascular system: S1 & S2 heard, RRR. No JVD, murmurs, rubs, gallops or clicks. No pedal edema. Gastrointestinal system: Abdomen is nondistended, soft and nontender. No organomegaly or masses felt. Slow  bowel sounds heard. Central nervous system: Alert and oriented. No focal neurological deficits. Extremities: No edema, no clubbing ,no cyanosis Skin: No rashes, lesions or ulcers,no icterus ,no pallor   Data Reviewed: I have personally reviewed following labs and imaging studies  CBC: Recent Labs  Lab 08/25/20 1813  08/27/20 0354  WBC 10.0 7.1  HGB 17.1* 14.7  HCT 49.7 44.5  MCV 82.3 86.1  PLT 320 238   Basic Metabolic Panel: Recent Labs  Lab 08/25/20 1813 08/27/20 0354  NA 136 141  K 4.0 3.7  CL 99 104  CO2 25 28  GLUCOSE 151* 92  BUN 18 10  CREATININE 1.04 1.03  CALCIUM 10.1 9.2   GFR: Estimated Creatinine Clearance: 133.1 mL/min (by C-G formula based on SCr of 1.03 mg/dL). Liver Function Tests: Recent Labs  Lab 08/25/20 1813 08/27/20 0354  AST 18 16  ALT 33 30  ALKPHOS 70 54  BILITOT 0.6 0.8  PROT 7.9 7.0  ALBUMIN 5.0 4.2   Recent Labs  Lab 08/25/20 1813  LIPASE 14   No results for input(s): AMMONIA in the last 168 hours. Coagulation Profile: No results for input(s): INR, PROTIME in the last 168 hours. Cardiac Enzymes: No results for input(s): CKTOTAL, CKMB, CKMBINDEX, TROPONINI in the last 168 hours. BNP (last 3 results) No results for input(s): PROBNP in the last 8760 hours. HbA1C: Recent Labs    08/26/20 1003  HGBA1C 5.5   CBG: No results for input(s): GLUCAP in the last 168 hours. Lipid Profile: No results for input(s): CHOL, HDL, LDLCALC, TRIG, CHOLHDL, LDLDIRECT in the last 72 hours. Thyroid Function Tests: No results for input(s): TSH, T4TOTAL, FREET4, T3FREE, THYROIDAB in the last 72 hours. Anemia Panel: No results for input(s): VITAMINB12, FOLATE, FERRITIN, TIBC, IRON, RETICCTPCT in the last 72 hours. Sepsis Labs: No results for input(s): PROCALCITON, LATICACIDVEN in the last 168 hours.  Recent Results (from the past 240 hour(s))  Resp Panel by RT-PCR (Flu A&B, Covid) Nasopharyngeal Swab     Status: Abnormal   Collection Time: 08/25/20 10:38 PM   Specimen: Nasopharyngeal Swab; Nasopharyngeal(NP) swabs in vial transport medium  Result Value Ref Range Status   SARS Coronavirus 2 by RT PCR POSITIVE (A) NEGATIVE Final    Comment: RESULT CALLED TO, READ BACK BY AND VERIFIED WITH: MAYNARD,J RN [id number] 08/26/20 BY BOWMAN,K (NOTE) SARS-CoV-2 target  nucleic acids are DETECTED.  The SARS-CoV-2 RNA is generally detectable in upper respiratory specimens during the acute phase of infection. Positive results are indicative of the presence of the identified virus, but do not rule out bacterial infection or co-infection with other pathogens not detected by the test. Clinical correlation with patient history and other diagnostic information is necessary to determine patient infection status. The expected result is Negative.  Fact Sheet for Patients: BloggerCourse.com  Fact Sheet for Healthcare Providers: SeriousBroker.it  This test is not yet approved or cleared by the [place] FDA and  has been authorized for detection and/or diagnosis of SARS-CoV-2 by FDA under an Emergency Use Authorization (EUA).  This EUA will remain in effect (meaning this test c an be used) for the duration of  the COVID-19 declaration under Section 564(b)(1) of  the Act, 21 U.S.C. section 360bbb-3(b)(1), unless the authorization is terminated or revoked sooner.     Influenza A by PCR NEGATIVE NEGATIVE Final   Influenza B by PCR NEGATIVE NEGATIVE Final    Comment: (NOTE) The Xpert Xpress SARS-CoV-2/FLU/RSV plus assay is intended as an aid in the diagnosis of influenza from Nasopharyngeal swab specimens and should not be used as a sole basis for treatment. Nasal washings and aspirates are unacceptable for Xpert Xpress SARS-CoV-2/FLU/RSV testing.  Fact Sheet for Patients: BloggerCourse.com  Fact Sheet for Healthcare Providers: SeriousBroker.it  This test is not yet approved or cleared by the [place] FDA and has been authorized for detection and/or diagnosis of SARS-CoV-2 by FDA under an Emergency Use Authorization (EUA). This EUA will remain in effect (meaning this test can be used) for the duration of the COVID-19 declaration under Section  564(b)(1) of the Act, 21 U.S.C. section 360bbb-3(b)(1), unless the authorization is terminated or revoked.  Performed at [facility], [address] [place], [place] [address]          Radiology Studies: CT Abdomen Pelvis Wo Contrast  Result Date: 08/25/2020 CLINICAL DATA:  Acute abdominal pain. Epigastric and left-sided abdominal pain. Nausea and vomiting. EXAM: CT ABDOMEN AND PELVIS WITHOUT CONTRAST TECHNIQUE: Multidetector CT imaging of the abdomen and pelvis was performed following the standard protocol without IV contrast. COMPARISON:  None. FINDINGS: Lower chest: Lung bases are clear. Hepatobiliary: Diffusely decreased hepatic density consistent with steatosis. No evidence of focal hepatic abnormality on noncontrast exam. Decompressed gallbladder. No calcified gallstone or pericholecystic inflammation. Pancreas: No ductal dilatation or inflammation. Spleen: Normal in size without focal abnormality. Splenule at the hilum. Adrenals/Urinary Tract: Normal adrenal glands. No hydronephrosis or renal calculi. No perinephric edema. No evidence of focal renal abnormality on noncontrast exam. Unremarkable urinary bladder. Stomach/Bowel: Marked gastric distention with intraluminal fluid. Small bowel diffusely dilated and fluid-filled. There is gradual transition from dilated to nondilated bowel loops in the right pelvis, though no discrete transition point. Distal ileal bowel loops are decompressed. There is mild mesenteric edema. No mesenteric swirling to suggest internal hernia. No bowel pneumatosis. Normal appendix. Small volume of colonic stool. Vascular/Lymphatic: Unremarkable noncontrast vascular structures. No abdominopelvic adenopathy. Reproductive: Prostate is unremarkable. Other: Minimal mesenteric edema. No ascites or free air. Tiny fat containing umbilical hernia. Minimal fat in the inguinal canals, right greater than left. Musculoskeletal: Mild L5-S1 degenerative disc  disease, no pars defects. There are no acute or suspicious osseous abnormalities. IMPRESSION: 1. Diffusely dilated and fluid-filled small bowel with gradual transition from dilated to nondilated bowel loops in the right pelvis, though no discrete transition point. There is mild mesenteric edema. Findings are suspicious for early small bowel obstruction, underlying cause is not delineated on this exam. 2. Hepatic steatosis. 3. Tiny fat containing umbilical hernia. Minimal fat in the inguinal canals, right greater than left. Electronically Signed   By: Narda Rutherford M.D.   On: 08/25/2020 22:08        Scheduled Meds: . buPROPion  200 mg Oral BID  . pantoprazole  40 mg Oral Daily   Continuous Infusions: . lactated ringers 100 mL/hr at 08/26/20 2112     LOS: 1 day    Time spent: 25  mins.More than 50% of that time was spent in counseling and/or coordination of care.      Burnadette Pop, MD [facility] P5/16/2022, 10:12 AM

## 2020-08-28 NOTE — Discharge Summary (Signed)
Physician Discharge Summary  Terry PriestKevin Ouch ZOX:[id number] DOB: 02-Jan-1986 DOA: 08/25/2020  PCP: Pcp, No  Admit date: 08/25/2020 Discharge date: 08/28/2020  Admitted From: Home Disposition:  Home  Discharge Condition:Stable CODE STATUS:FULL Diet recommendation: Regular  Brief/Interim Summary:  Patient is a 35 year old male with history of anxiety/depression who presented with abdomen pain, nausea and vomiting.  He thought he was constipated and took some laxatives and had a small bowel movement but it did not help the pain.  He presented to the emergency department.  Lab work showed hyperglycemia.  CT abdomen/pelvis done in the emerge department showed possible early SBO, he was tachycardic on presentation.  After the CT scan ,he had bowel movements and his nausea and vomiting improved so NG tube not placed.  He continues to feel better today, had a bowel movement yesterday evening.  Abdomen is soft and nontender and nondistended presentation.  He is medically stable for discharge to home today. He is tolerating advanced diet.  Following problems were addressed during his hospitalization:  Early SBO/nausea/vomiting/abdominal pain: CT abdomen/pelvis showed diffusely dilated, fluid-filled small bowel with gradual transition from dilated to nondilated bowel loops in the right pelvis.  No discrete transition point.  Findings with suspicion for early small bowel SBO After the CT scan, he started having bowel movements, so NG tube not placed.  His abdomen remains soft and nondistended today.  He denies any abdominal pain nausea or vomiting.    Follow-up abdomen x-ray did not show any signs of SBO.  He is tolerating advance diet.  COVID positivity: Initially found to be positive on 4/17.  Screening test was negative as per the rapid test was done at home few days ago.  He is going to work.  We will continue off isolation, Out of window for isolation,.  He does not have any symptoms of COVID.  No  indication for treatment.   Anxiety/depression: On Wellbutrin  Hyperglycemia:Likely stress related. Hemoglobin A1c of 5.5.    Discharge Diagnoses:  Active Problems:   SBO (small bowel obstruction) [facility](HCC)    Discharge Instructions  Discharge Instructions    Diet general   Complete by: As directed    Discharge instructions   Complete by: As directed    1)Please follow up with your PCP in a week.   Increase activity slowly   Complete by: As directed      Allergies as of 08/28/2020   No Known Allergies     Medication List    TAKE these medications   ASHWAGANDHA PO Take 1 capsule by mouth daily.   Boron 6 MG Tabs Take 1 tablet by mouth daily.   BROMELAIN PO Take 1 capsule by mouth daily.   buPROPion 200 MG 12 hr tablet Commonly known as: WELLBUTRIN SR Take 200 mg by mouth 2 (two) times daily.   cholecalciferol 25 MCG (1000 UNIT) tablet Commonly known as: VITAMIN D3 Take 1,000 Units by mouth daily.   Digestive Enzymes Caps Take 1 capsule by mouth in the morning and at bedtime.   fluticasone 50 MCG/ACT nasal spray Commonly known as: FLONASE Place 1 spray into both nostrils daily.   loratadine 10 MG tablet Commonly known as: CLARITIN Take 10 mg by mouth daily.   multivitamin capsule Take 1 capsule by mouth in the morning and at bedtime.   pantoprazole 40 MG tablet Commonly known as: PROTONIX Take 40 mg by mouth daily.   peppermint oil liquid 1 drop by Does not apply route in the morning and  at bedtime.   QUERCETIN PO Take 1 capsule by mouth daily.   Super C-500 Tabs Take 1 tablet by mouth daily.   Turmeric 500 MG Caps Take 1 capsule by mouth daily.       No Known Allergies  Consultations:  None   Procedures/Studies: CT Abdomen Pelvis Wo Contrast  Result Date: 08/25/2020 CLINICAL DATA:  Acute abdominal pain. Epigastric and left-sided abdominal pain. Nausea and vomiting. EXAM: CT ABDOMEN AND PELVIS WITHOUT CONTRAST TECHNIQUE:  Multidetector CT imaging of the abdomen and pelvis was performed following the standard protocol without IV contrast. COMPARISON:  None. FINDINGS: Lower chest: Lung bases are clear. Hepatobiliary: Diffusely decreased hepatic density consistent with steatosis. No evidence of focal hepatic abnormality on noncontrast exam. Decompressed gallbladder. No calcified gallstone or pericholecystic inflammation. Pancreas: No ductal dilatation or inflammation. Spleen: Normal in size without focal abnormality. Splenule at the hilum. Adrenals/Urinary Tract: Normal adrenal glands. No hydronephrosis or renal calculi. No perinephric edema. No evidence of focal renal abnormality on noncontrast exam. Unremarkable urinary bladder. Stomach/Bowel: Marked gastric distention with intraluminal fluid. Small bowel diffusely dilated and fluid-filled. There is gradual transition from dilated to nondilated bowel loops in the right pelvis, though no discrete transition point. Distal ileal bowel loops are decompressed. There is mild mesenteric edema. No mesenteric swirling to suggest internal hernia. No bowel pneumatosis. Normal appendix. Small volume of colonic stool. Vascular/Lymphatic: Unremarkable noncontrast vascular structures. No abdominopelvic adenopathy. Reproductive: Prostate is unremarkable. Other: Minimal mesenteric edema. No ascites or free air. Tiny fat containing umbilical hernia. Minimal fat in the inguinal canals, right greater than left. Musculoskeletal: Mild L5-S1 degenerative disc disease, no pars defects. There are no acute or suspicious osseous abnormalities. IMPRESSION: 1. Diffusely dilated and fluid-filled small bowel with gradual transition from dilated to nondilated bowel loops in the right pelvis, though no discrete transition point. There is mild mesenteric edema. Findings are suspicious for early small bowel obstruction, underlying cause is not delineated on this exam. 2. Hepatic steatosis. 3. Tiny fat containing  umbilical hernia. Minimal fat in the inguinal canals, right greater than left. Electronically Signed   By: Narda Rutherford M.D.   On: 08/25/2020 22:08   DG Abd 1 View  Result Date: 08/27/2020 CLINICAL DATA:  Recent small-bowel obstruction.  Re-evaluate. EXAM: ABDOMEN - 1 VIEW COMPARISON:  CT of 08/25/2020 FINDINGS: Single supine view of the abdomen and pelvis. Gas within upper caliber small bowel loops, without residual bowel obstruction. Normal caliber gas-filled colon. No gross free intraperitoneal air on the supine radiograph. No abnormal abdominal calcifications. No appendicolith. IMPRESSION: Resolved small-bowel obstruction pattern. Upper normal size caliber gas-filled small bowel loops persist. Electronically Signed   By: Jeronimo Greaves M.D.   On: 08/27/2020 13:58       Subjective: Patient seen and examined at the bedside this morning.  Hemodynamically stable for discharge today.   Discharge Exam: Vitals:   08/27/20 2030 08/28/20 0500  BP: (!) 126/96 (!) 141/97  Pulse: 85 72  Resp: 20 18  Temp: 98.3 F (36.8 C) 97.8 F (36.6 C)  SpO2: 97% 97%   Vitals:   08/27/20 0455 08/27/20 1249 08/27/20 2030 08/28/20 0500  BP: 134/89 138/83 (!) 126/96 (!) 141/97  Pulse: 80 96 85 72  Resp: 18 17 20 18   Temp: 98.1 F (36.7 C) 98 F (36.7 C) 98.3 F (36.8 C) 97.8 F (36.6 C)  TempSrc: Oral Oral Oral Oral  SpO2: 99% 98% 97% 97%  Weight:      Height:  General: Pt is alert, awake, not in acute distress Cardiovascular: RRR, S1/S2 +, no rubs, no gallops Respiratory: CTA bilaterally, no wheezing, no rhonchi Abdominal: Soft, NT, ND, bowel sounds + Extremities: no edema, no cyanosis    The results of significant diagnostics from this hospitalization (including imaging, microbiology, ancillary and laboratory) are listed below for reference.     Microbiology: Recent Results (from the past 240 hour(s))  Resp Panel by RT-PCR (Flu A&B, Covid) Nasopharyngeal Swab     Status:  Abnormal   Collection Time: 08/25/20 10:38 PM   Specimen: Nasopharyngeal Swab; Nasopharyngeal(NP) swabs in vial transport medium  Result Value Ref Range Status   SARS Coronavirus 2 by RT PCR POSITIVE (A) NEGATIVE Final    Comment: RESULT CALLED TO, READ BACK BY AND VERIFIED WITH: MAYNARD,J RN 1610 08/26/20 BY BOWMAN,K (NOTE) SARS-CoV-2 target nucleic acids are DETECTED.  The SARS-CoV-2 RNA is generally detectable in upper respiratory specimens during the acute phase of infection. Positive results are indicative of the presence of the identified virus, but do not rule out bacterial infection or co-infection with other pathogens not detected by the test. Clinical correlation with patient history and other diagnostic information is necessary to determine patient infection status. The expected result is Negative.  Fact Sheet for Patients: BloggerCourse.com  Fact Sheet for Healthcare Providers: SeriousBroker.it  This test is not yet approved or cleared by the [place] FDA and  has been authorized for detection and/or diagnosis of SARS-CoV-2 by FDA under an Emergency Use Authorization (EUA).  This EUA will remain in effect (meaning this test c an be used) for the duration of  the COVID-19 declaration under Section 564(b)(1) of the Act, 21 U.S.C. section 360bbb-3(b)(1), unless the authorization is terminated or revoked sooner.     Influenza A by PCR NEGATIVE NEGATIVE Final   Influenza B by PCR NEGATIVE NEGATIVE Final    Comment: (NOTE) The Xpert Xpress SARS-CoV-2/FLU/RSV plus assay is intended as an aid in the diagnosis of influenza from Nasopharyngeal swab specimens and should not be used as a sole basis for treatment. Nasal washings and aspirates are unacceptable for Xpert Xpress SARS-CoV-2/FLU/RSV testing.  Fact Sheet for Patients: BloggerCourse.com  Fact Sheet for Healthcare  Providers: SeriousBroker.it  This test is not yet approved or cleared by the [place] FDA and has been authorized for detection and/or diagnosis of SARS-CoV-2 by FDA under an Emergency Use Authorization (EUA). This EUA will remain in effect (meaning this test can be used) for the duration of the COVID-19 declaration under Section 564(b)(1) of the Act, 21 U.S.C. section 360bbb-3(b)(1), unless the authorization is terminated or revoked.  Performed at [facility], [address], [place], [place] [address]      Labs: BNP (last 3 results) No results for input(s): BNP in the last 8760 hours. Basic Metabolic Panel: Recent Labs  Lab 08/25/20 1813 08/27/20 0354  NA 136 141  K 4.0 3.7  CL 99 104  CO2 25 28  GLUCOSE 151* 92  BUN 18 10  CREATININE 1.04 1.03  CALCIUM 10.1 9.2   Liver Function Tests: Recent Labs  Lab 08/25/20 1813 08/27/20 0354  AST 18 16  ALT 33 30  ALKPHOS 70 54  BILITOT 0.6 0.8  PROT 7.9 7.0  ALBUMIN 5.0 4.2   Recent Labs  Lab 08/25/20 1813  LIPASE 14   No results for input(s): AMMONIA in the last 168 hours. CBC: Recent Labs  Lab 08/25/20 1813 08/27/20 0354  WBC 10.0 7.1  HGB 17.1* 14.7  HCT 49.7 44.5  MCV 82.3 86.1  PLT 320 238   Cardiac Enzymes: No results for input(s): CKTOTAL, CKMB, CKMBINDEX, TROPONINI in the last 168 hours. BNP: Invalid input(s): POCBNP CBG: No results for input(s): GLUCAP in the last 168 hours. D-Dimer No results for input(s): DDIMER in the last 72 hours. Hgb A1c Recent Labs    08/26/20 1003  HGBA1C 5.5   Lipid Profile No results for input(s): CHOL, HDL, LDLCALC, TRIG, CHOLHDL, LDLDIRECT in the last 72 hours. Thyroid function studies No results for input(s): TSH, T4TOTAL, T3FREE, THYROIDAB in the last 72 hours.  Invalid input(s): FREET3 Anemia work up No results for input(s): VITAMINB12, FOLATE, FERRITIN, TIBC, IRON, RETICCTPCT in the last 72  hours. Urinalysis    Component Value Date/Time   COLORURINE YELLOW 08/25/2020 1802   APPEARANCEUR CLEAR 08/25/2020 1802   LABSPEC 1.009 08/25/2020 1802   PHURINE 6.0 08/25/2020 1802   GLUCOSEU NEGATIVE 08/25/2020 1802   HGBUR NEGATIVE 08/25/2020 1802   BILIRUBINUR NEGATIVE 08/25/2020 1802   KETONESUR NEGATIVE 08/25/2020 1802   PROTEINUR NEGATIVE 08/25/2020 1802   NITRITE NEGATIVE 08/25/2020 1802   LEUKOCYTESUR NEGATIVE 08/25/2020 1802   Sepsis Labs Invalid input(s): PROCALCITONIN,  WBC,  LACTICIDVEN Microbiology Recent Results (from the past 240 hour(s))  Resp Panel by RT-PCR (Flu A&B, Covid) Nasopharyngeal Swab     Status: Abnormal   Collection Time: 08/25/20 10:38 PM   Specimen: Nasopharyngeal Swab; Nasopharyngeal(NP) swabs in vial transport medium  Result Value Ref Range Status   SARS Coronavirus 2 by RT PCR POSITIVE (A) NEGATIVE Final    Comment: RESULT CALLED TO, READ BACK BY AND VERIFIED WITH: MAYNARD,J RN 3244 08/26/20 BY BOWMAN,K (NOTE) SARS-CoV-2 target nucleic acids are DETECTED.  The SARS-CoV-2 RNA is generally detectable in upper respiratory specimens during the acute phase of infection. Positive results are indicative of the presence of the identified virus, but do not rule out bacterial infection or co-infection with other pathogens not detected by the test. Clinical correlation with patient history and other diagnostic information is necessary to determine patient infection status. The expected result is Negative.  Fact Sheet for Patients: BloggerCourse.com  Fact Sheet for Healthcare Providers: SeriousBroker.it  This test is not yet approved or cleared by the [place] FDA and  has been authorized for detection and/or diagnosis of SARS-CoV-2 by FDA under an Emergency Use Authorization (EUA).  This EUA will remain in effect (meaning this test c an be used) for the duration of  the COVID-19 declaration  under Section 564(b)(1) of the Act, 21 U.S.C. section 360bbb-3(b)(1), unless the authorization is terminated or revoked sooner.     Influenza A by PCR NEGATIVE NEGATIVE Final   Influenza B by PCR NEGATIVE NEGATIVE Final    Comment: (NOTE) The Xpert Xpress SARS-CoV-2/FLU/RSV plus assay is intended as an aid in the diagnosis of influenza from Nasopharyngeal swab specimens and should not be used as a sole basis for treatment. Nasal washings and aspirates are unacceptable for Xpert Xpress SARS-CoV-2/FLU/RSV testing.  Fact Sheet for Patients: BloggerCourse.com  Fact Sheet for Healthcare Providers: SeriousBroker.it  This test is not yet approved or cleared by the [place] FDA and has been authorized for detection and/or diagnosis of SARS-CoV-2 by FDA under an Emergency Use Authorization (EUA). This EUA will remain in effect (meaning this test can be used) for the duration of the COVID-19 declaration under Section 564(b)(1) of the Act, 21 U.S.C. section 360bbb-3(b)(1), unless the authorization is terminated or revoked.  Performed at [facility], [address] [place], [place] [address]     Please note: You were cared for by a hospitalist during your hospital stay. Once you are discharged, your primary care physician will handle any further medical issues. Please note that NO REFILLS for any discharge medications will be authorized once you are discharged, as it is imperative that you return to your primary care physician (or establish a relationship with a primary care physician if you do not have one) for your post hospital discharge needs so that they can reassess your need for medications and monitor your lab values.    Time coordinating discharge: 40 minutes  SIGNED:   Burnadette Pop, MD  [facility] 08/28/2020, 11:07 AM Pager [phone]  If 7PM-7AM, please contact  night-coverage [email or web address] Password [email or web address]

## 2020-08-28 NOTE — Plan of Care (Signed)
Pt education completed prior to discharge. Pt in home/ personal clothing for discharge. Pt to be discharged to home with self care via family to pick him up. All pt belongings accounted for by pt prior to discharge and taken with pt. Pt safety maintained.  Problem: Education: Goal: Knowledge of General Education information will improve Description: Including pain rating scale, medication(s)/side effects and non-pharmacologic comfort measures Outcome: Adequate for Discharge   Problem: Health Behavior/Discharge Planning: Goal: Ability to manage health-related needs will improve Outcome: Adequate for Discharge   Problem: Clinical Measurements: Goal: Ability to maintain clinical measurements within normal limits will improve Outcome: Adequate for Discharge Goal: Will remain free from infection Outcome: Adequate for Discharge Goal: Diagnostic test results will improve Outcome: Adequate for Discharge Goal: Respiratory complications will improve Outcome: Adequate for Discharge Goal: Cardiovascular complication will be avoided Outcome: Adequate for Discharge   Problem: Activity: Goal: Risk for activity intolerance will decrease Outcome: Adequate for Discharge   Problem: Nutrition: Goal: Adequate nutrition will be maintained Outcome: Adequate for Discharge   Problem: Coping: Goal: Level of anxiety will decrease Outcome: Adequate for Discharge   Problem: Elimination: Goal: Will not experience complications related to bowel motility Outcome: Adequate for Discharge Goal: Will not experience complications related to urinary retention Outcome: Adequate for Discharge   Problem: Pain Managment: Goal: General experience of comfort will improve Outcome: Adequate for Discharge   Problem: Safety: Goal: Ability to remain free from injury will improve Outcome: Adequate for Discharge   Problem: Skin Integrity: Goal: Risk for impaired skin integrity will decrease Outcome: Adequate for  Discharge

## 2020-11-13 ENCOUNTER — Other Ambulatory Visit: Payer: Self-pay | Admitting: Gastroenterology

## 2020-11-13 ENCOUNTER — Ambulatory Visit
Admission: RE | Admit: 2020-11-13 | Discharge: 2020-11-13 | Disposition: A | Discharge status: Home / Self Care | Payer: 59 | Source: Ambulatory Visit | Attending: Gastroenterology | Admitting: Gastroenterology

## 2020-11-13 ENCOUNTER — Other Ambulatory Visit: Payer: Self-pay

## 2020-11-13 DIAGNOSIS — K5900 Constipation, unspecified: Secondary | ICD-10-CM

## 2022-06-09 ENCOUNTER — Other Ambulatory Visit: Payer: Self-pay | Admitting: Family Medicine

## 2022-06-09 DIAGNOSIS — R319 Hematuria, unspecified: Secondary | ICD-10-CM

## 2022-06-23 ENCOUNTER — Ambulatory Visit
Admission: RE | Admit: 2022-06-23 | Discharge: 2022-06-23 | Disposition: A | Discharge status: Home / Self Care | Payer: 59 | Source: Ambulatory Visit | Attending: Family Medicine | Admitting: Family Medicine

## 2022-06-23 DIAGNOSIS — R319 Hematuria, unspecified: Secondary | ICD-10-CM

## 2022-06-23 MED ORDER — IOPAMIDOL (ISOVUE-300) INJECTION 61%
150.0000 mL | Freq: Once | INTRAVENOUS | Status: AC | PRN
  Administered 2022-06-23: 150 mL via INTRAVENOUS

## 2022-07-02 ENCOUNTER — Ambulatory Visit: Payer: 59 | Admitting: Urology

## 2022-07-02 ENCOUNTER — Encounter: Payer: Self-pay | Admitting: Urology

## 2022-07-02 VITALS — BP 136/89 | HR 112 | Ht 71.0 in | Wt 265.0 lb

## 2022-07-02 DIAGNOSIS — R310 Gross hematuria: Secondary | ICD-10-CM

## 2022-07-02 DIAGNOSIS — N329 Bladder disorder, unspecified: Secondary | ICD-10-CM

## 2022-07-02 LAB — URINALYSIS, ROUTINE W REFLEX MICROSCOPIC
Bilirubin, UA: NEGATIVE
Glucose, UA: NEGATIVE
Ketones, UA: NEGATIVE
Leukocytes,UA: NEGATIVE
Nitrite, UA: NEGATIVE
Protein,UA: NEGATIVE
RBC, UA: NEGATIVE
Specific Gravity, UA: 1.015 (ref 1.005–1.030)
Urobilinogen, Ur: 0.2 mg/dL (ref 0.2–1.0)
pH, UA: 6 (ref 5.0–7.5)

## 2022-07-02 NOTE — Progress Notes (Signed)
   Assessment: 1. Lesion of bladder   2. Gross hematuria      Plan: Today I had a long discussion with the patient regarding his recent gross hematuria and abnormal CT suggesting a bladder lesion.  I have recommended cystoscopy for further evaluation.  Nature procedure reviewed in detail today including potential adverse events and complications.  Patient reports that he has a lot of anxiety and does not wish to pursue that today.  He does have Xanax at home and he will take that 2 hours prior to his scheduled appointment and we will schedule him for cystoscopy next week.  He understands that someone needs to drive him to that visit.  Chief Complaint: blood in urine  History of Present Illness:  Terry Anderson is a 37 y.o. male who is seen in consultation from Dr. London Pepper for evaluation of hematuria.  Several weeks ago the patient noted gross hematuria with the passage of several small clots.  He denied any dysuria or other voiding complaints.  The urine subsequently cleared and he saw his PCP who recommended further evaluation including CT and urologic referral.  CT has demonstrated a small approximately 8 mm lesion on the left lateral wall of the bladder posteriorly best seen on prone imaging.  Patient reports that after his urine cleared initially he has again had some intermittent gross hematuria the last few days.  Past urologic history is remarkable for hematuria over 10 years ago at which time he apparently saw urology and was treated with an antibiotic and reassured.  He denies any current constitutional or other GU complaints.    Past Medical History:  anxiety  Past Surgical History:  No past surgical history on file.  Allergies:  No Known Allergies  Family History:  No family history on file.  Social History:  Social History   Tobacco Use   Smoking status: Never   Smokeless tobacco: Never    Review of symptoms:  Constitutional:  Negative for unexplained weight loss,  night sweats, fever, chills ENT:  Negative for nose bleeds, sinus pain, painful swallowing CV:  Negative for chest pain, shortness of breath, exercise intolerance, palpitations, loss of consciousness Resp:  Negative for cough, wheezing, shortness of breath GI:  Negative for nausea, vomiting, diarrhea, bloody stools GU:  Positives noted in HPI; otherwise negative for gross hematuria, dysuria, urinary incontinence Neuro:  Negative for seizures, poor balance, limb weakness, slurred speech Psych:  Negative for lack of energy, depression, anxiety Endocrine:  Negative for polydipsia, polyuria, symptoms of hypoglycemia (dizziness, hunger, sweating) Hematologic:  Negative for anemia, purpura, petechia, prolonged or excessive bleeding, use of anticoagulants  Allergic:  Negative for difficulty breathing or choking as a result of exposure to anything; no shellfish allergy; no allergic response (rash/itch) to materials, foods  Physical exam: BP 136/89   Pulse (!) 112   Ht 5\' 11"  (1.803 m)   Wt 265 lb (120.2 kg)   BMI 36.96 kg/m  GENERAL APPEARANCE:  Well appearing, well developed, well nourished, NAD   Results: CT heme evaluation-- 06/23/2022--- IMPRESSION: 1. Hypodense wall adherent nodular filling defect in the left side of the urinary bladder measuring 8 mm, which is nonspecific but may reflect artifact, a small blood clot or neoplasm. Consider further evaluation with direct visualization. 2. No hydronephrosis. No renal, ureteral or bladder calculi. 3. No solid enhancing renal mass. 4. Hepatomegaly with diffuse hepatic steatosi

## 2022-07-10 ENCOUNTER — Ambulatory Visit: Payer: Self-pay | Admitting: Urology

## 2022-07-10 ENCOUNTER — Encounter: Payer: Self-pay | Admitting: Urology

## 2022-07-10 ENCOUNTER — Ambulatory Visit: Payer: 59 | Admitting: Urology

## 2022-07-10 VITALS — BP 145/97 | HR 106

## 2022-07-10 DIAGNOSIS — N3289 Other specified disorders of bladder: Secondary | ICD-10-CM | POA: Diagnosis not present

## 2022-07-10 DIAGNOSIS — N329 Bladder disorder, unspecified: Secondary | ICD-10-CM

## 2022-07-10 DIAGNOSIS — R310 Gross hematuria: Secondary | ICD-10-CM | POA: Diagnosis not present

## 2022-07-10 DIAGNOSIS — D494 Neoplasm of unspecified behavior of bladder: Secondary | ICD-10-CM

## 2022-07-10 LAB — URINALYSIS, ROUTINE W REFLEX MICROSCOPIC
Bilirubin, UA: NEGATIVE
Glucose, UA: NEGATIVE
Ketones, UA: NEGATIVE
Leukocytes,UA: NEGATIVE
Nitrite, UA: NEGATIVE
Protein,UA: NEGATIVE
RBC, UA: NEGATIVE
Specific Gravity, UA: 1.015 (ref 1.005–1.030)
Urobilinogen, Ur: 0.2 mg/dL (ref 0.2–1.0)
pH, UA: 7 (ref 5.0–7.5)

## 2022-07-10 MED ORDER — SULFAMETHOXAZOLE-TRIMETHOPRIM 800-160 MG PO TABS
1.0000 | ORAL_TABLET | Freq: Once | ORAL | Status: AC
  Administered 2022-07-10: 1 via ORAL

## 2022-07-10 NOTE — Progress Notes (Signed)
   Assessment: 1. Gross hematuria   2. Lesion of bladder     Plan: Today following the cystoscopy I sat down with the patient and his wife and had a long discussion with them regarding the finding of a bladder lesion that is suspicious for likely low-grade urothelial cancer.  I have discussed with him the natural history of low-grade TCC and recommendations for further evaluation including TURBT plus perioperative chemotherapy.  Rationale and nature procedures reviewed in detail.  Patient agrees to proceed.  Will set up next available.  Chief Complaint: Chief Complaint  Patient presents with   Cysto    HPI: Terry Anderson is a 37 y.o. male who presents for continued evaluation of hematuria and bladder lesion noted on CT scan. Patient is here today for planned cystoscopy.  He took Xanax prior to the procedure for anxiety.  Please see my note 07/02/2022 at the time of initial visit for detailed history.  The patient presented with a history of intermittent gross hematuria a few weeks ago.  The gross hematuria subsequently cleared but had recurred and was intermittent over the last few days.  The patient subsequently underwent CT hematuria evaluation which demonstrated an 8 mm lesion on the left lateral wall of the bladder posteriorly best seen on prone imaging.   Portions of the above documentation were copied from a prior visit for review purposes only.  Allergies: No Known Allergies  PMH: Past Medical History:  Diagnosis Date   Anxiety    Depression     PSH: History reviewed. No pertinent surgical history.  SH: Social History   Tobacco Use   Smoking status: Never   Smokeless tobacco: Never    ROS: Constitutional:  Negative for fever, chills, weight loss CV: Negative for chest pain, previous MI, hypertension Respiratory:  Negative for shortness of breath, wheezing, sleep apnea, frequent cough GI:  Negative for nausea, vomiting, bloody stool, GERD  PE: BP (!) 145/97    Pulse (!) 106  GENERAL APPEARANCE:  Well appearing, well developed, well nourished, NAD HEENT:  Atraumatic, normocephalic, oropharynx clear Cor:  RR LUNGS:  CTA ABDOMEN:  Soft, non-tender, no masses EXTREMITIES:  Moves all extremities well, without clubbing, cyanosis, or edema NEUROLOGIC:  Alert and oriented x 3, normal gait, CN II-XII grossly intact MENTAL STATUS:  appropriate BACK:  Non-tender to palpation, No CVAT SKIN:  Warm, dry, and intact   PROCEDURE:  CYSTOURETHROSCOPY  INDICATION: Gross hematuria and abnormal CT of bladder  DESCRIPTION OF PROCEDURE: Following the attainment of informed consent, patient was positioned supine on the procedure table and prepped and draped in usual sterile fashion.  Flexible cystoscopy was subsequently performed.  A preprocedural timeout was performed.  Anterior urethra:  Normal  Prostatic urethra: Normal  Bladder: Small approximately 10 mm friable papillary lesion consistent with likely low-grade TCC very difficult to see due to the fact that it is posteriorly located just inside the bladder neck on the left.  It is best seen with retroflexion of the scope.  Ureteral openings are of normal configuration and location  Procedure well-tolerated-no complications.

## 2022-07-15 ENCOUNTER — Telehealth: Payer: Self-pay | Admitting: Urology

## 2022-07-15 NOTE — Telephone Encounter (Signed)
Spoke with wife, Jonelle Sidle, she is concerned about pre-existing conditions for the new insurance. Advised her to call the new insurance company to inquire about this. If any documentation is required we can provide this for them.

## 2022-07-15 NOTE — Telephone Encounter (Signed)
Patients spouse called in regards to scheduling his surgery & stated that patient is changing insurance on April 21st so he would like to schedule surgery prior to this date,  Patients spouse, Tiffany: [phone]

## 2022-07-28 NOTE — Patient Instructions (Signed)
SURGICAL WAITING ROOM VISITATION  Patients having surgery or a procedure may have no more than 2 support people in the waiting area - these visitors may rotate.    Children under the age of 52 must have an adult with them who is not the patient.  Due to an increase in RSV and influenza rates and associated hospitalizations, children ages 77 and under may not visit patients in [facility] hospitals.  If the patient needs to stay at the hospital during part of their recovery, the visitor guidelines for inpatient rooms apply. Pre-op nurse will coordinate an appropriate time for 1 support person to accompany patient in pre-op.  This support person may not rotate.    Please refer to the [facility] website for the visitor guidelines for Inpatients (after your surgery is over and you are in a regular room).       Your procedure is scheduled on:  08/11/22    Report to [facility] Main Entrance    Report to admitting at  1100 AM   Call this number if you have problems the morning of surgery [phone]   Do not eat food  or drink liquids :After Midnight.   After Midnight you may have the following liquids until _ 1000_____ AM DAY OF SURGERY                  If you have questions, please contact your surgeon's office.       Oral Hygiene is also important to reduce your risk of infection.                                    Remember - BRUSH YOUR TEETH THE MORNING OF SURGERY WITH YOUR REGULAR TOOTHPASTE  DENTURES WILL BE REMOVED PRIOR TO SURGERY PLEASE DO NOT APPLY "Poly grip" OR ADHESIVES!!!   Do NOT smoke after Midnight   Take these medicines the morning of surgery with A SIP OF WATER:  wellbiutin, claritin, protonix, flonase   DO NOT TAKE ANY ORAL DIABETIC MEDICATIONS DAY OF YOUR SURGERY  Bring CPAP mask and tubing day of surgery.                              You may not have any metal on your body including hair pins, jewelry, and body piercing             Do not  wear make-up, lotions, powders, perfumes/cologne, or deodorant  Do not wear nail polish including gel and S&S, artificial/acrylic nails, or any other type of covering on natural nails including finger and toenails. If you have artificial nails, gel coating, etc. that needs to be removed by a nail salon please have this removed prior to surgery or surgery may need to be canceled/ delayed if the surgeon/ anesthesia feels like they are unable to be safely monitored.   Do not shave  48 hours prior to surgery.               Men may shave face and neck.   Do not bring valuables to the hospital. [facility] IS NOT             RESPONSIBLE   FOR VALUABLES.   Contacts, glasses, dentures or bridgework may not be worn into surgery.   Bring small overnight bag day of surgery.   DO  NOT BRING YOUR HOME MEDICATIONS TO THE HOSPITAL. PHARMACY WILL DISPENSE MEDICATIONS LISTED ON YOUR MEDICATION LIST TO YOU DURING YOUR ADMISSION IN THE HOSPITAL!    Patients discharged on the day of surgery will not be allowed to drive home.  Someone NEEDS to stay with you for the first 24 hours after anesthesia.   Special Instructions: Bring a copy of your healthcare power of attorney and living will documents the day of surgery if you haven't scanned them before.              Please read over the following fact sheets you were given: IF YOU HAVE QUESTIONS ABOUT YOUR PRE-OP INSTRUCTIONS PLEASE CALL [phone]   If you received a COVID test during your pre-op visit  it is requested that you wear a mask when out in public, stay away from anyone that may not be feeling well and notify your surgeon if you develop symptoms. If you test positive for Covid or have been in contact with anyone that has tested positive in the last 10 days please notify you surgeon.    [facility] - Preparing for Surgery Before surgery, you can play an important role.  Because skin is not sterile, your skin needs to be as free of germs as possible.   You can reduce the number of germs on your skin by washing with CHG (chlorahexidine gluconate) soap before surgery.  CHG is an antiseptic cleaner which kills germs and bonds with the skin to continue killing germs even after washing. Please DO NOT use if you have an allergy to CHG or antibacterial soaps.  If your skin becomes reddened/irritated stop using the CHG and inform your nurse when you arrive at Short Stay. Do not shave (including legs and underarms) for at least 48 hours prior to the first CHG shower.  You may shave your face/neck. Please follow these instructions carefully:  1.  Shower with CHG Soap the night before surgery and the  morning of Surgery.  2.  If you choose to wash your hair, wash your hair first as usual with your  normal  shampoo.  3.  After you shampoo, rinse your hair and body thoroughly to remove the  shampoo.                           4.  Use CHG as you would any other liquid soap.  You can apply chg directly  to the skin and wash                       Gently with a scrungie or clean washcloth.  5.  Apply the CHG Soap to your body ONLY FROM THE NECK DOWN.   Do not use on face/ open                           Wound or open sores. Avoid contact with eyes, ears mouth and genitals (private parts).                       Wash face,  Genitals (private parts) with your normal soap.             6.  Wash thoroughly, paying special attention to the area where your surgery  will be performed.  7.  Thoroughly rinse your body with warm water from the neck down.  8.  DO NOT shower/wash with your normal soap after using and rinsing off  the CHG Soap.                9.  Pat yourself dry with a clean towel.            10.  Wear clean pajamas.            11.  Place clean sheets on your bed the night of your first shower and do not  sleep with pets. Day of Surgery : Do not apply any lotions/deodorants the morning of surgery.  Please wear clean clothes to the hospital/surgery  center.  FAILURE TO FOLLOW THESE INSTRUCTIONS MAY RESULT IN THE CANCELLATION OF YOUR SURGERY PATIENT SIGNATURE_________________________________  NURSE SIGNATURE__________________________________  ________________________________________________________________________

## 2022-07-28 NOTE — Progress Notes (Signed)
Anesthesia Review:  PCP: Theron Arista Cherly Hensen 06/27/22  Cardiologist : Chest x-ray : EKG : Echo : Stress test: Cardiac Cath :  Activity level:  Sleep Study/ CPAP : Fasting Blood Sugar :      / Checks Blood Sugar -- times a day:   Blood Thinner/ Instructions /Last Dose: ASA / Instructions/ Last Dose :

## 2022-07-29 ENCOUNTER — Encounter (HOSPITAL_COMMUNITY): Payer: Self-pay

## 2022-07-29 ENCOUNTER — Other Ambulatory Visit: Payer: Self-pay

## 2022-07-29 ENCOUNTER — Encounter (HOSPITAL_COMMUNITY)
Admission: RE | Admit: 2022-07-29 | Discharge: 2022-07-29 | Disposition: A | Discharge status: Home / Self Care | Payer: 59 | Source: Ambulatory Visit | Attending: Urology | Admitting: Urology

## 2022-07-29 DIAGNOSIS — Z01818 Encounter for other preprocedural examination: Secondary | ICD-10-CM | POA: Insufficient documentation

## 2022-07-29 HISTORY — DX: Sleep apnea, unspecified: G47.30

## 2022-07-29 HISTORY — DX: Gastro-esophageal reflux disease without esophagitis: K21.9

## 2022-07-29 HISTORY — DX: Malignant (primary) neoplasm, unspecified: C80.1

## 2022-08-07 NOTE — Progress Notes (Signed)
Patient phoned to give updated information on surgery.  Date of Surgery - 08-12-22  Arrival Time - check in at admitting at 11:00 AM.    NPO Status - patient reminded to not eat solid food or drink liquids after midnight.    Medications morning of surgery with a sip of water - Bupropion and Claritin   No change in medical history, allergies per patient.  Surgery was rescheduled by the surgeon.  Transportation home - [facility]  All questions answered and patient stated understanding

## 2022-08-11 DIAGNOSIS — Z01818 Encounter for other preprocedural examination: Secondary | ICD-10-CM

## 2022-08-12 ENCOUNTER — Ambulatory Visit (HOSPITAL_COMMUNITY)
Admission: RE | Admit: 2022-08-12 | Discharge: 2022-08-12 | Disposition: A | Discharge status: Home / Self Care | Payer: 59 | Attending: Urology | Admitting: Urology

## 2022-08-12 ENCOUNTER — Encounter (HOSPITAL_COMMUNITY): Payer: Self-pay | Admitting: Urology

## 2022-08-12 ENCOUNTER — Ambulatory Visit (HOSPITAL_BASED_OUTPATIENT_CLINIC_OR_DEPARTMENT_OTHER): Payer: 59 | Admitting: Anesthesiology

## 2022-08-12 ENCOUNTER — Encounter (HOSPITAL_COMMUNITY)
Admission: RE | Disposition: A | Discharge status: Home / Self Care | Payer: Self-pay | Source: Home / Self Care | Attending: Urology

## 2022-08-12 ENCOUNTER — Other Ambulatory Visit: Payer: Self-pay

## 2022-08-12 ENCOUNTER — Ambulatory Visit (HOSPITAL_COMMUNITY): Payer: 59 | Admitting: Anesthesiology

## 2022-08-12 DIAGNOSIS — D303 Benign neoplasm of bladder: Secondary | ICD-10-CM

## 2022-08-12 DIAGNOSIS — Z2989 Encounter for other specified prophylactic measures: Secondary | ICD-10-CM | POA: Diagnosis not present

## 2022-08-12 DIAGNOSIS — G4730 Sleep apnea, unspecified: Secondary | ICD-10-CM | POA: Insufficient documentation

## 2022-08-12 DIAGNOSIS — N329 Bladder disorder, unspecified: Secondary | ICD-10-CM | POA: Diagnosis not present

## 2022-08-12 DIAGNOSIS — F418 Other specified anxiety disorders: Secondary | ICD-10-CM

## 2022-08-12 DIAGNOSIS — Z01818 Encounter for other preprocedural examination: Secondary | ICD-10-CM

## 2022-08-12 DIAGNOSIS — R310 Gross hematuria: Secondary | ICD-10-CM | POA: Insufficient documentation

## 2022-08-12 DIAGNOSIS — D414 Neoplasm of uncertain behavior of bladder: Secondary | ICD-10-CM | POA: Diagnosis not present

## 2022-08-12 DIAGNOSIS — D494 Neoplasm of unspecified behavior of bladder: Secondary | ICD-10-CM

## 2022-08-12 LAB — BASIC METABOLIC PANEL
Anion gap: 12 (ref 5–15)
BUN: 16 mg/dL (ref 6–20)
CO2: 23 mmol/L (ref 22–32)
Calcium: 9.7 mg/dL (ref 8.9–10.3)
Chloride: 104 mmol/L (ref 98–111)
Creatinine, Ser: 1.08 mg/dL (ref 0.61–1.24)
GFR, Estimated: >60 mL/min (ref >60)
Glucose, Bld: 101 mg/dL — ABNORMAL HIGH (ref 70–99)
Potassium: 3.8 mmol/L (ref 3.5–5.1)
Sodium: 139 mmol/L (ref 135–145)

## 2022-08-12 LAB — CBC
HCT: 47.1 % (ref 39.0–52.0)
Hemoglobin: 15.9 g/dL (ref 13.0–17.0)
MCH: 28.1 pg (ref 26.0–34.0)
MCHC: 33.8 g/dL (ref 30.0–36.0)
MCV: 83.4 fL (ref 80.0–100.0)
Platelets: 389 10*3/uL (ref 150–400)
RBC: 5.65 MIL/uL (ref 4.22–5.81)
RDW: 11.6 % (ref 11.5–15.5)
WBC: 7.3 10*3/uL (ref 4.0–10.5)
nRBC: 0 % (ref 0.0–0.2)

## 2022-08-12 SURGERY — TURBT, WITH CHEMOTHERAPEUTIC AGENT INSTILLATION INTO BLADDER
Anesthesia: General

## 2022-08-12 MED ORDER — FENTANYL CITRATE (PF) 100 MCG/2ML IJ SOLN
INTRAMUSCULAR | Status: DC | PRN
  Administered 2022-08-12 (×2): 50 ug via INTRAVENOUS

## 2022-08-12 MED ORDER — ORAL CARE MOUTH RINSE: 15.0000 mL | Freq: Once | OROMUCOSAL | Status: AC

## 2022-08-12 MED ORDER — LIDOCAINE HCL (PF) 2 % IJ SOLN
INTRAMUSCULAR | Status: AC
  Filled 2022-08-12: qty 5

## 2022-08-12 MED ORDER — SODIUM CHLORIDE 0.9 % IV SOLN
2.0000 g | INTRAVENOUS | Status: AC
  Administered 2022-08-12: 2 g via INTRAVENOUS
  Filled 2022-08-12: qty 20

## 2022-08-12 MED ORDER — DEXAMETHASONE SODIUM PHOSPHATE 10 MG/ML IJ SOLN
INTRAMUSCULAR | Status: DC | PRN
  Administered 2022-08-12: 10 mg via INTRAVENOUS

## 2022-08-12 MED ORDER — SUGAMMADEX SODIUM 200 MG/2ML IV SOLN
INTRAVENOUS | Status: DC | PRN
  Administered 2022-08-12: 50 mg via INTRAVENOUS
  Administered 2022-08-12: 250 mg via INTRAVENOUS

## 2022-08-12 MED ORDER — PROPOFOL 10 MG/ML IV BOLUS
INTRAVENOUS | Status: AC
  Filled 2022-08-12: qty 20

## 2022-08-12 MED ORDER — MIDAZOLAM HCL 5 MG/5ML IJ SOLN
INTRAMUSCULAR | Status: DC | PRN
  Administered 2022-08-12: 2 mg via INTRAVENOUS

## 2022-08-12 MED ORDER — FENTANYL CITRATE PF 50 MCG/ML IJ SOSY: 25.0000 ug | PREFILLED_SYRINGE | INTRAMUSCULAR | Status: DC | PRN

## 2022-08-12 MED ORDER — STERILE WATER FOR IRRIGATION IR SOLN
Status: DC | PRN
  Administered 2022-08-12: 1000 mL

## 2022-08-12 MED ORDER — KETAMINE HCL 50 MG/5ML IJ SOSY
PREFILLED_SYRINGE | INTRAMUSCULAR | Status: AC
  Filled 2022-08-12: qty 5

## 2022-08-12 MED ORDER — KETAMINE HCL 10 MG/ML IJ SOLN
INTRAMUSCULAR | Status: DC | PRN
  Administered 2022-08-12: 30 mg via INTRAVENOUS

## 2022-08-12 MED ORDER — FENTANYL CITRATE (PF) 100 MCG/2ML IJ SOLN
INTRAMUSCULAR | Status: AC
  Filled 2022-08-12: qty 2

## 2022-08-12 MED ORDER — PROPOFOL 10 MG/ML IV BOLUS
INTRAVENOUS | Status: DC | PRN
  Administered 2022-08-12: 200 mg via INTRAVENOUS

## 2022-08-12 MED ORDER — ACETAMINOPHEN 160 MG/5ML PO SOLN: 325.0000 mg | ORAL | Status: DC | PRN

## 2022-08-12 MED ORDER — CHLORHEXIDINE GLUCONATE 0.12 % MT SOLN
15.0000 mL | Freq: Once | OROMUCOSAL | Status: AC
  Administered 2022-08-12: 15 mL via OROMUCOSAL

## 2022-08-12 MED ORDER — ACETAMINOPHEN 325 MG PO TABS: 325.0000 mg | ORAL_TABLET | ORAL | Status: DC | PRN

## 2022-08-12 MED ORDER — ACETAMINOPHEN 10 MG/ML IV SOLN: 1000.0000 mg | Freq: Once | INTRAVENOUS | Status: DC | PRN

## 2022-08-12 MED ORDER — OXYCODONE HCL 5 MG PO TABS: 5.0000 mg | ORAL_TABLET | Freq: Once | ORAL | Status: DC | PRN

## 2022-08-12 MED ORDER — ROCURONIUM BROMIDE 10 MG/ML (PF) SYRINGE
PREFILLED_SYRINGE | INTRAVENOUS | Status: AC
  Filled 2022-08-12: qty 10

## 2022-08-12 MED ORDER — ONDANSETRON HCL 4 MG/2ML IJ SOLN
INTRAMUSCULAR | Status: DC | PRN
  Administered 2022-08-12: 4 mg via INTRAVENOUS

## 2022-08-12 MED ORDER — ROCURONIUM BROMIDE 100 MG/10ML IV SOLN
INTRAVENOUS | Status: DC | PRN
  Administered 2022-08-12: 60 mg via INTRAVENOUS

## 2022-08-12 MED ORDER — LACTATED RINGERS IV SOLN: INTRAVENOUS | Status: DC

## 2022-08-12 MED ORDER — SODIUM CHLORIDE 0.9 % IR SOLN
Status: DC | PRN
  Administered 2022-08-12: 3000 mL via INTRAVESICAL

## 2022-08-12 MED ORDER — LIDOCAINE HCL URETHRAL/MUCOSAL 2 % EX GEL
CUTANEOUS | Status: DC | PRN
  Administered 2022-08-12: 1 via URETHRAL

## 2022-08-12 MED ORDER — OXYCODONE HCL 5 MG/5ML PO SOLN: 5.0000 mg | Freq: Once | ORAL | Status: DC | PRN

## 2022-08-12 MED ORDER — ONDANSETRON HCL 4 MG/2ML IJ SOLN
INTRAMUSCULAR | Status: AC
  Filled 2022-08-12: qty 2

## 2022-08-12 MED ORDER — GEMCITABINE CHEMO FOR BLADDER INSTILLATION 2000 MG
2000.0000 mg | Freq: Once | INTRAVENOUS | Status: AC
  Administered 2022-08-12: 2000 mg via INTRAVESICAL
  Filled 2022-08-12: qty 52.6

## 2022-08-12 MED ORDER — ACETAMINOPHEN 500 MG PO TABS: 1000.0000 mg | ORAL_TABLET | Freq: Once | ORAL | Status: DC

## 2022-08-12 MED ORDER — AMISULPRIDE (ANTIEMETIC) 5 MG/2ML IV SOLN: 10.0000 mg | Freq: Once | INTRAVENOUS | Status: DC | PRN

## 2022-08-12 MED ORDER — LIDOCAINE HCL (CARDIAC) PF 100 MG/5ML IV SOSY
PREFILLED_SYRINGE | INTRAVENOUS | Status: DC | PRN
  Administered 2022-08-12: 60 mg via INTRAVENOUS

## 2022-08-12 MED ORDER — PROMETHAZINE HCL 25 MG/ML IJ SOLN: 6.2500 mg | INTRAMUSCULAR | Status: DC | PRN

## 2022-08-12 MED ORDER — MIDAZOLAM HCL 2 MG/2ML IJ SOLN
INTRAMUSCULAR | Status: AC
  Filled 2022-08-12: qty 2

## 2022-08-12 MED ORDER — DEXAMETHASONE SODIUM PHOSPHATE 10 MG/ML IJ SOLN
INTRAMUSCULAR | Status: AC
  Filled 2022-08-12: qty 1

## 2022-08-12 SURGICAL SUPPLY — 20 items
BAG DRN RND TRDRP ANRFLXCHMBR (UROLOGICAL SUPPLIES) ×1
BAG URINE DRAIN 2000ML AR STRL (UROLOGICAL SUPPLIES) IMPLANT
BAG URO CATCHER STRL LF (MISCELLANEOUS) ×1 IMPLANT
CATH FOLEY 2WAY SLVR  5CC 18FR (CATHETERS) ×1
CATH FOLEY 2WAY SLVR 5CC 18FR (CATHETERS) IMPLANT
DRAPE FOOT SWITCH (DRAPES) ×1 IMPLANT
ELECT REM PT RETURN 15FT ADLT (MISCELLANEOUS) ×1 IMPLANT
EVACUATOR MICROVAS BLADDER (UROLOGICAL SUPPLIES) IMPLANT
GLOVE SURG LX STRL 7.5 STRW (GLOVE) ×1 IMPLANT
GOWN STRL REUS W/ TWL XL LVL3 (GOWN DISPOSABLE) ×1 IMPLANT
GOWN STRL REUS W/TWL XL LVL3 (GOWN DISPOSABLE) ×1
KIT TURNOVER KIT A (KITS) IMPLANT
LOOP CUT BIPOLAR 24F LRG (ELECTROSURGICAL) IMPLANT
MANIFOLD NEPTUNE II (INSTRUMENTS) ×1 IMPLANT
PACK CYSTO (CUSTOM PROCEDURE TRAY) ×1 IMPLANT
PENCIL SMOKE EVACUATOR (MISCELLANEOUS) IMPLANT
SYR TOOMEY IRRIG 70ML (MISCELLANEOUS)
SYRINGE TOOMEY IRRIG 70ML (MISCELLANEOUS) IMPLANT
TUBING CONNECTING 10 (TUBING) ×1 IMPLANT
TUBING UROLOGY SET (TUBING) ×1 IMPLANT

## 2022-08-12 NOTE — Op Note (Signed)
OPERATIVE NOTE   Patient Name: Terry Anderson  MRN: [id number]   Date of Procedure: 08/12/2022   Preoperative diagnosis:  Bladder tumor  Postoperative diagnosis:  same  Procedure:  Cystoscopy, TURBT (2cm resection left lateral bladder floor); instillation of intravesical chemotherapy  Attending: Concepcion Living, MD  Anesthesia: geta  Estimated blood loss: minimal  Fluids: Per anesthesia record  Drains: 18 Fr Foley  Specimens: bladder tumor chips  Antibiotics: 2gm cefipime  Findings: papillary (low grade appearing) bladder tumor 1.5cm left bladder floor beginning 2cm lateral to the left UO.  Completely resected  Indications:  Bladder tumor with gross hematuria  Description of Procedure:  Patient was taken to the operating room where he was correctly identified and placed in the supine position on the operating room table.  Patient subsequently underwent induction of general endotracheal anesthesia.  Patient was then positioned in the modified dorsolithotomy position prepped and draped in usual sterile fashion.  Preprocedural timeout was subsequently performed.  At this time cystoscopy was performed with a 22 [place] panendoscope.  The anterior urethra was normal.  Prostatic urethra also appeared normal with mild lateral lobe enlargement and a slightly elevated bladder neck.  The bladder was subsequently entered and carefully inspected with both a 30 and 70 degree lens.  The ureteral openings appeared normal.  There was noted to be a 1.5 cm papillary (low-grade appearing) bladder tumor beginning approximately 2 cm lateral to the left ureteral orifice.  The remainder of the bladder appeared normal without additional mucosal abnormalities.  At this time the 25 [place] continuous-flow resectoscope was placed with a visual obturator.  I then used the bipolar apparatus for resecting the bladder tumor.  The papillary component of the tumor was completely resected down to the base  where I did take 1 swipe which entered into the detrusor.  All gross tumor was resected.  The area was then fulgurated for hemostasis.  The greatest dimension of resection was 2 cm.  The specimen was subsequently retrieved through the resectoscope and sent for pathologic analysis. An 65 French Foley catheter was subsequently placed. Patient was subsequently extubated and taken to the recovery room in satisfactory condition.  In the PACU I supervised the instillation of 2 g of gentamicin intravesical chemotherapy with a dwell time of 45 minutes.  This was also well-tolerated.  Complications: None  Condition: Stable, extubated, transferred to PACU  Plan: FU in office in 2 weeks

## 2022-08-12 NOTE — Anesthesia Preprocedure Evaluation (Addendum)
Anesthesia Evaluation  Patient identified by MRN, date of birth, ID band Patient awake    Reviewed: Allergy & Precautions, NPO status , Patient's Chart, lab work & pertinent test results  Airway Mallampati: I  TM Distance: >3 FB Neck ROM: Full    Dental  (+) Teeth Intact, Dental Advisory Given   Pulmonary sleep apnea    breath sounds clear to auscultation       Cardiovascular negative cardio ROS  Rhythm:Regular Rate:Normal     Neuro/Psych  PSYCHIATRIC DISORDERS Anxiety Depression    negative neurological ROS     GI/Hepatic Neg liver ROS,GERD  ,,  Endo/Other  negative endocrine ROS    Renal/GU negative Renal ROS     Musculoskeletal negative musculoskeletal ROS (+)    Abdominal   Peds  Hematology negative hematology ROS (+)   Anesthesia Other Findings   Reproductive/Obstetrics                             Anesthesia Physical Anesthesia Plan  ASA: 2  Anesthesia Plan: General   Post-op Pain Management: Tylenol PO (pre-op)*   Induction: Intravenous  PONV Risk Score and Plan: 3 and Ondansetron, Dexamethasone and Midazolam  Airway Management Planned: Oral ETT and LMA  Additional Equipment: None  Intra-op Plan:   Post-operative Plan: Extubation in OR  Informed Consent: I have reviewed the patients History and Physical, chart, labs and discussed the procedure including the risks, benefits and alternatives for the proposed anesthesia with the patient or authorized representative who has indicated his/her understanding and acceptance.     Dental advisory given  Plan Discussed with: CRNA  Anesthesia Plan Comments:        Anesthesia Quick Evaluation

## 2022-08-12 NOTE — Anesthesia Procedure Notes (Signed)
Procedure Name: Intubation Date/Time: 08/12/2022 1:18 PM  Performed by: Johnette Abraham, CRNAPre-anesthesia Checklist: Patient identified, Emergency Drugs available, Suction available and Patient being monitored Patient Re-evaluated:Patient Re-evaluated prior to induction Oxygen Delivery Method: Circle System Utilized Preoxygenation: Pre-oxygenation with 100% oxygen Induction Type: IV induction Ventilation: Mask ventilation without difficulty Laryngoscope Size: Mac and 4 Grade View: Grade II Tube type: Oral Tube size: 8.0 mm Number of attempts: 1 Airway Equipment and Method: Stylet and Oral airway Placement Confirmation: ETT inserted through vocal cords under direct vision, positive ETCO2 and breath sounds checked- equal and bilateral Secured at: 23 cm Tube secured with: Tape Dental Injury: Teeth and Oropharynx as per pre-operative assessment

## 2022-08-12 NOTE — Anesthesia Postprocedure Evaluation (Signed)
Anesthesia Post Note  Patient: Terry Anderson  Procedure(s) Performed: TRANSURETHRAL RESECTION OF BLADDER TUMOR (TURBT) with GEMCITABINE     Patient location during evaluation: PACU Anesthesia Type: General Level of consciousness: awake and alert Pain management: pain level controlled Vital Signs Assessment: post-procedure vital signs reviewed and stable Respiratory status: spontaneous breathing, nonlabored ventilation, respiratory function stable and patient connected to nasal cannula oxygen Cardiovascular status: blood pressure returned to baseline and stable Postop Assessment: no apparent nausea or vomiting Anesthetic complications: no  No notable events documented.  Last Vitals:  Vitals:   08/12/22 1531 08/12/22 1540  BP: (!) 141/105 (!) 160/97  Pulse: (!) 101 (!) 106  Resp: 18   Temp: 36.6 C   SpO2: 95% 95%    Last Pain:  Vitals:   08/12/22 1531  TempSrc:   PainSc: 0-No pain                 Shelton Silvas

## 2022-08-12 NOTE — Transfer of Care (Signed)
Immediate Anesthesia Transfer of Care Note  Patient: Terry Anderson  Procedure(s) Performed: TRANSURETHRAL RESECTION OF BLADDER TUMOR (TURBT) with GEMCITABINE  Patient Location: PACU  Anesthesia Type:General  Level of Consciousness: awake, alert , oriented, and patient cooperative  Airway & Oxygen Therapy: Patient Spontanous Breathing and Patient connected to face mask oxygen  Post-op Assessment: Report given to RN, Post -op Vital signs reviewed and stable, and Patient moving all extremities  Post vital signs: Reviewed and stable  Last Vitals:  Vitals Value Taken Time  BP 137/91 08/12/22 1402  Temp    Pulse 107 08/12/22 1403  Resp 25 08/12/22 1403  SpO2 98 % 08/12/22 1403  Vitals shown include unvalidated device data.  Last Pain:  Vitals:   08/12/22 1123  TempSrc:   PainSc: 0-No pain         Complications: No notable events documented.

## 2022-08-13 LAB — SURGICAL PATHOLOGY

## 2022-08-14 ENCOUNTER — Telehealth: Payer: Self-pay | Admitting: Urology

## 2022-08-14 NOTE — Telephone Encounter (Signed)
Patient called in regards to paperwork, didn't know if Dr Margo Aye was able to return his paperwork, he needs to get that returned to work & he had questions about his 2 weeks of light duty or when his full return to work would be.   Patients callback #: [phone]

## 2022-08-14 NOTE — Telephone Encounter (Signed)
I have completed the forms but they have not been signed. They will be left on your desk for your signature.

## 2022-08-14 NOTE — H&P (Signed)
  UROLOGY H&p Assessment: 1. Preop testing   2. Bladder tumor     Plan: Cystoscopy with bladder bx/TURBT + INTRAVESICAL CHEMO  Chief Complaint: Bladder tumor  HPI: Terry Anderson is a 37 y.o. male who presents today for planned transurethral resection of bladder tumor.  Patient presented recently with gross hematuria and underwent CT-hematuria protocol which demonstrated normal upper urinary tracts but did suggest a bladder lesion on the left bladder floor.  Office cystoscopy was confirmatory.  The patient presents here today for transurethral resection and instillation of intravesical chemotherapy.  Informed consent obtained.   Portions of the above documentation were copied from a prior visit for review purposes only.  Allergies: No Known Allergies  PMH: Past Medical History:  Diagnosis Date   Anxiety    Cancer (HCC)    Bladder cancer   Depression    GERD (gastroesophageal reflux disease)    Sleep apnea     PSH: Past Surgical History:  Procedure Laterality Date   wistom teeth extractinon       SH: Social History   Tobacco Use   Smoking status: Never   Smokeless tobacco: Never  Vaping Use   Vaping Use: Never used  Substance Use Topics   Alcohol use: Not Currently   Drug use: Never    ROS: Constitutional:  Negative for fever, chills, weight loss CV: Negative for chest pain, previous MI, hypertension Respiratory:  Negative for shortness of breath, wheezing, sleep apnea, frequent cough GI:  Negative for nausea, vomiting, bloody stool, GERD  PE: BP (!) 160/97   Pulse (!) 106   Temp 97.9 F (36.6 C)   Resp 18   Ht 5\' 11"  (1.803 m)   Wt 122.5 kg   SpO2 95%   BMI 37.66 kg/m  GENERAL APPEARANCE:  Well appearing, well developed, well nourished, NAD HEENT:  Atraumatic, normocephalic, COR:  RR LUNGS:  CTA ABDOMEN:  Soft, non-tender, no masses EXTREMITIES:  Moves all extremities well NEUROLOGIC:  Alert and oriented x 3, normal gait, CN II-XII grossly  intact SKIN:  Warm, dry, and intact

## 2022-08-15 DIAGNOSIS — Z0271 Encounter for disability determination: Secondary | ICD-10-CM

## 2022-08-21 ENCOUNTER — Ambulatory Visit: Payer: 59 | Admitting: Urology

## 2022-08-21 ENCOUNTER — Encounter: Payer: Self-pay | Admitting: Urology

## 2022-08-21 VITALS — BP 138/90 | HR 125

## 2022-08-21 DIAGNOSIS — R310 Gross hematuria: Secondary | ICD-10-CM

## 2022-08-21 DIAGNOSIS — D494 Neoplasm of unspecified behavior of bladder: Secondary | ICD-10-CM

## 2022-08-21 NOTE — Progress Notes (Signed)
   Assessment: 1. Bladder tumor   2. 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  We discussed the implications including excellent prognosis but need for ongoing endoscopic surveillance. Patient will return in 3 to 4 months for cystoscopy.  If that is negative we will proceed with yearly follow-up.  Chief Complaint: Bladder tumor  HPI: Terry Anderson is a 37 y.o. male who presents for continued evaluation of bladder tumor. Patient is status post TURBT 08/12/2022 for a small papillary tumor lateral to the left ureteral orifice. Pathology = papillary neoplasm low malignant potential Patient has done very well postoperatively.  He reports no hematuria and minimal lower urinary tract symptoms.  No current complaints.  Portions of the above documentation were copied from a prior visit for review purposes only.  Allergies: No Known Allergies  PMH: Past Medical History:  Diagnosis Date   Anxiety    Cancer (HCC)    Bladder cancer   Depression    GERD (gastroesophageal reflux disease)    Sleep apnea     PSH: Past Surgical History:  Procedure Laterality Date   wistom teeth extractinon       SH: Social History   Tobacco Use   Smoking status: Never   Smokeless tobacco: Never  Vaping Use   Vaping Use: Never used  Substance Use Topics   Alcohol use: Not Currently   Drug use: Never    ROS: Constitutional:  Negative for fever, chills, weight loss CV: Negative for chest pain, previous MI, hypertension Respiratory:  Negative for shortness of breath, wheezing, sleep apnea, frequent cough GI:  Negative for nausea, vomiting, bloody stool, GERD  PE: BP (!) 138/90   Pulse (!) 125  GENERAL APPEARANCE:  Well appearing, well developed, well nourished, NAD    Results: UA neg for infection

## 2022-08-22 LAB — URINALYSIS, ROUTINE W REFLEX MICROSCOPIC
Bilirubin, UA: NEGATIVE
Glucose, UA: NEGATIVE
Ketones, UA: NEGATIVE
Leukocytes,UA: NEGATIVE
Nitrite, UA: NEGATIVE
Protein,UA: NEGATIVE
Specific Gravity, UA: 1.015 (ref 1.005–1.030)
Urobilinogen, Ur: 0.2 mg/dL (ref 0.2–1.0)
pH, UA: 6.5 (ref 5.0–7.5)

## 2022-08-22 LAB — MICROSCOPIC EXAMINATION
Cast Type: NONE SEEN
Casts: NONE SEEN /lpf
Crystal Type: NONE SEEN
Crystals: NONE SEEN
Epithelial Cells (non renal): NONE SEEN /hpf (ref 0–10)
Renal Epithel, UA: NONE SEEN /hpf
Trichomonas, UA: NONE SEEN
Yeast, UA: NONE SEEN

## 2022-08-25 ENCOUNTER — Encounter: Payer: Self-pay | Admitting: Urology

## 2022-11-19 ENCOUNTER — Other Ambulatory Visit: Payer: 59 | Admitting: Urology

## 2022-11-20 ENCOUNTER — Ambulatory Visit: Payer: BC Managed Care – PPO | Admitting: Urology

## 2022-11-20 VITALS — BP 128/89 | HR 116

## 2022-11-20 DIAGNOSIS — R310 Gross hematuria: Secondary | ICD-10-CM | POA: Diagnosis not present

## 2022-11-20 DIAGNOSIS — D494 Neoplasm of unspecified behavior of bladder: Secondary | ICD-10-CM

## 2022-11-20 NOTE — Progress Notes (Signed)
   Assessment: 1. Bladder tumor   2. Gross hematuria     Plan As noted, patient had significant anxiety and refused to undergo cystoscopic surveillance today.  Following this I discussed with him and his wife the option of setting this up at the surgical center under IV sedation.  They are going to consider and will contact us with whether or not they want to do that are try again to do this in the office setting.  Patient did take Xanax prior to the procedure today.     Chief Complaint: Bladder tumor  HPI: Terry Anderson is a 37 y.o. male who presents for continued evaluation of bladder tumor. Patient is status post TURBT 08/12/2022 for small papillary tumor lateral to the left ureteral orifice with pathology demonstrating PNLMP.  Patient is here today for scheduled cystoscopy surveillance. Unfortunately, the patient had significant anxiety after prepping him for cystoscopy and refused to proceed with the procedure.  Portions of the above documentation were copied from a prior visit for review purposes only.  Allergies: No Known Allergies  PMH: Past Medical History:  Diagnosis Date   Anxiety    Cancer (HCC)    Bladder cancer   Depression    GERD (gastroesophageal reflux disease)    Sleep apnea     PSH: Past Surgical History:  Procedure Laterality Date   wistom teeth extractinon       SH: Social History   Tobacco Use   Smoking status: Never   Smokeless tobacco: Never  Vaping Use   Vaping status: Never Used  Substance Use Topics   Alcohol use: Not Currently   Drug use: Never    ROS: Constitutional:  Negative for fever, chills, weight loss CV: Negative for chest pain, previous MI, hypertension Respiratory:  Negative for shortness of breath, wheezing, sleep apnea, frequent cough GI:  Negative for nausea, vomiting, bloody stool, GERD  PE: BP 128/89   Pulse (!) 116  GENERAL APPEARANCE:  Well appearing, well developed, well nourished, NAD    Results: UA  neg
